# Patient Record
Sex: Male | Born: 1990 | Race: White | Hispanic: No | Marital: Single | Smoking: Current every day smoker
Health system: Southern US, Community
[De-identification: ages and names within clinical notes are randomized; demographics above are authoritative.]

## PROBLEM LIST (undated history)

## (undated) HISTORY — PX: KNEE SURGERY: SHX244

## (undated) HISTORY — PX: FINGER AMPUTATION: SHX636

---
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
## 2008-04-29 ENCOUNTER — Emergency Department: Payer: Self-pay | Admitting: Emergency Medicine

## 2008-11-04 ENCOUNTER — Emergency Department: Payer: Self-pay | Admitting: Emergency Medicine

## 2009-01-06 ENCOUNTER — Emergency Department: Payer: Self-pay | Admitting: Emergency Medicine

## 2009-02-10 ENCOUNTER — Emergency Department: Payer: Self-pay | Admitting: Emergency Medicine

## 2009-03-10 ENCOUNTER — Emergency Department: Payer: Self-pay | Admitting: Emergency Medicine

## 2009-03-22 ENCOUNTER — Emergency Department: Payer: Self-pay | Admitting: Internal Medicine

## 2010-01-28 ENCOUNTER — Emergency Department: Payer: Self-pay | Admitting: Emergency Medicine

## 2011-03-07 ENCOUNTER — Emergency Department: Payer: Self-pay | Admitting: Internal Medicine

## 2011-03-07 LAB — URINALYSIS, COMPLETE
Bacteria: NONE SEEN
Bilirubin,UR: NEGATIVE
Blood: NEGATIVE
Glucose,UR: NEGATIVE mg/dL (ref 0–75)
Ketone: NEGATIVE
Leukocyte Esterase: NEGATIVE
Nitrite: NEGATIVE
Ph: 6 (ref 4.5–8.0)
Protein: NEGATIVE
RBC,UR: NONE SEEN /HPF (ref 0–5)
Specific Gravity: 1.025 (ref 1.003–1.030)
Squamous Epithelial: NONE SEEN
WBC UR: 1 /HPF (ref 0–5)

## 2011-03-07 LAB — COMPREHENSIVE METABOLIC PANEL
Albumin: 4.2 g/dL (ref 3.4–5.0)
Alkaline Phosphatase: 52 U/L (ref 50–136)
Anion Gap: 8 (ref 7–16)
BUN: 13 mg/dL (ref 7–18)
Bilirubin,Total: 1.9 mg/dL — ABNORMAL HIGH (ref 0.2–1.0)
Calcium, Total: 9 mg/dL (ref 8.5–10.1)
Chloride: 110 mmol/L — ABNORMAL HIGH (ref 98–107)
Co2: 26 mmol/L (ref 21–32)
Creatinine: 0.89 mg/dL (ref 0.60–1.30)
EGFR (African American): >60
EGFR (Non-African Amer.): >60
Glucose: 120 mg/dL — ABNORMAL HIGH (ref 65–99)
Osmolality: 288 (ref 275–301)
Potassium: 4.4 mmol/L (ref 3.5–5.1)
SGOT(AST): 24 U/L (ref 15–37)
SGPT (ALT): 23 U/L
Sodium: 144 mmol/L (ref 136–145)
Total Protein: 7.1 g/dL (ref 6.4–8.2)

## 2011-03-07 LAB — CBC
HCT: 47.5 % (ref 40.0–52.0)
HGB: 15.6 g/dL (ref 13.0–18.0)
MCH: 31.1 pg (ref 26.0–34.0)
MCHC: 32.9 g/dL (ref 32.0–36.0)
MCV: 95 fL (ref 80–100)
Platelet: 136 10*3/uL — ABNORMAL LOW (ref 150–440)
RBC: 5.03 10*6/uL (ref 4.40–5.90)
RDW: 13.2 % (ref 11.5–14.5)
WBC: 10.1 10*3/uL (ref 3.8–10.6)

## 2011-03-07 LAB — LIPASE, BLOOD: Lipase: 65 U/L — ABNORMAL LOW (ref 73–393)

## 2012-01-07 ENCOUNTER — Emergency Department: Payer: Self-pay | Admitting: Emergency Medicine

## 2012-03-12 ENCOUNTER — Emergency Department: Payer: Self-pay | Admitting: Emergency Medicine

## 2012-03-12 LAB — RAPID INFLUENZA A&B ANTIGENS

## 2012-03-15 LAB — BETA STREP CULTURE(ARMC)

## 2012-05-19 ENCOUNTER — Emergency Department: Payer: Self-pay | Admitting: Emergency Medicine
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
## 2014-03-13 ENCOUNTER — Emergency Department: Payer: Self-pay | Admitting: Emergency Medicine

## 2014-09-13 DIAGNOSIS — S61411A Laceration without foreign body of right hand, initial encounter: Secondary | ICD-10-CM | POA: Insufficient documentation

## 2014-09-13 DIAGNOSIS — Y9389 Activity, other specified: Secondary | ICD-10-CM | POA: Insufficient documentation

## 2014-09-13 DIAGNOSIS — W260XXA Contact with knife, initial encounter: Secondary | ICD-10-CM | POA: Insufficient documentation

## 2014-09-13 DIAGNOSIS — Y9289 Other specified places as the place of occurrence of the external cause: Secondary | ICD-10-CM | POA: Insufficient documentation

## 2014-09-13 DIAGNOSIS — Y998 Other external cause status: Secondary | ICD-10-CM | POA: Insufficient documentation

## 2014-09-13 MED ORDER — OXYCODONE-ACETAMINOPHEN 5-325 MG PO TABS
ORAL_TABLET | ORAL | Status: AC
  Administered 2014-09-13: 1 via ORAL
  Filled 2014-09-13: qty 1

## 2014-09-13 MED ORDER — OXYCODONE-ACETAMINOPHEN 5-325 MG PO TABS
1.0000 | ORAL_TABLET | Freq: Once | ORAL | Status: AC
  Administered 2014-09-13: 1 via ORAL

## 2014-09-13 NOTE — ED Notes (Signed)
Pt to ED with laceration to palmar side of R hand. Bleeding is controlled, placed clean bandage in triage.

## 2014-09-14 ENCOUNTER — Emergency Department
Admission: EM | Admit: 2014-09-14 | Discharge: 2014-09-14 | Disposition: A | Discharge status: Home / Self Care | Payer: Self-pay | Attending: Emergency Medicine | Admitting: Emergency Medicine

## 2014-09-14 DIAGNOSIS — IMO0002 Reserved for concepts with insufficient information to code with codable children: Secondary | ICD-10-CM

## 2014-09-14 MED ORDER — BACITRACIN ZINC 500 UNIT/GM EX OINT
TOPICAL_OINTMENT | CUTANEOUS | Status: AC
  Filled 2014-09-14: qty 0.9

## 2014-09-14 MED ORDER — OXYCODONE-ACETAMINOPHEN 5-325 MG PO TABS
1.0000 | ORAL_TABLET | Freq: Once | ORAL | Status: AC
  Administered 2014-09-14: 1 via ORAL
  Filled 2014-09-14: qty 1

## 2014-09-14 MED ORDER — HYDROCODONE-ACETAMINOPHEN 5-325 MG PO TABS: 1.0000 | ORAL_TABLET | ORAL | Status: DC | PRN

## 2014-09-14 MED ORDER — LIDOCAINE HCL (PF) 1 % IJ SOLN
INTRAMUSCULAR | Status: AC
  Filled 2014-09-14: qty 5

## 2014-09-14 NOTE — Discharge Instructions (Signed)
Laceration Care, Adult A laceration is a cut that goes through all layers of the skin. The cut goes into the tissue beneath the skin. HOME CARE For stitches (sutures) or staples:  Keep the cut clean and dry.  If you have a bandage (dressing), change it at least once a day. Change the bandage if it gets wet or dirty, or as told by your doctor.  Wash the cut with soap and water 2 times a day. Rinse the cut with water. Pat it dry with a clean towel.  Put a thin layer of medicated cream on the cut as told by your doctor.  You may shower after the first 24 hours. Do not soak the cut in water until the stitches are removed.  Only take medicines as told by your doctor.  Have your stitches or staples removed as told by your doctor. For skin adhesive strips:  Keep the cut clean and dry.  Do not get the strips wet. You may take a bath, but be careful to keep the cut dry.  If the cut gets wet, pat it dry with a clean towel.  The strips will fall off on their own. Do not remove the strips that are still stuck to the cut. For wound glue:  You may shower or take baths. Do not soak or scrub the cut. Do not swim. Avoid heavy sweating until the glue falls off on its own. After a shower or bath, pat the cut dry with a clean towel.  Do not put medicine on your cut until the glue falls off.  If you have a bandage, do not put tape over the glue.  Avoid lots of sunlight or tanning lamps until the glue falls off. Put sunscreen on the cut for the first year to reduce your scar.  The glue will fall off on its own. Do not pick at the glue. You may need a tetanus shot if:  You cannot remember when you had your last tetanus shot.  You have never had a tetanus shot. If you need a tetanus shot and you choose not to have one, you may get tetanus. Sickness from tetanus can be serious. GET HELP RIGHT AWAY IF:   Your pain does not get better with medicine.  Your arm, hand, leg, or foot loses feeling  (numbness) or changes color.  Your cut is bleeding.  Your joint feels weak, or you cannot use your joint.  You have painful lumps on your body.  Your cut is red, puffy (swollen), or painful.  You have a red line on the skin near the cut.  You have yellowish-white fluid (pus) coming from the cut.  You have a fever.  You have a bad smell coming from the cut or bandage.  Your cut breaks open before or after stitches are removed.  You notice something coming out of the cut, such as wood or glass.  You cannot move a finger or toe. MAKE SURE YOU:   Understand these instructions.  Will watch your condition.  Will get help right away if you are not doing well or get worse. Document Released: 07/28/2007 Document Revised: 05/03/2011 Document Reviewed: 08/04/2010 [facility] Patient Information 2015 [place], [place]. This information is not intended to replace advice given to you by your health care provider. Make sure you discuss any questions you have with your health care provider.      Please follow-up with your primary care doctor or return to the emergency department in  7-10 days for suture removal. This keep the area clean, covered in Neosporin, and a bandage at all times. Return to the emergency department for any signs of infection such as increased pain, increased redness, pus or if you develop a fever.

## 2014-09-14 NOTE — ED Provider Notes (Signed)
[facility] Emergency Department Provider Note  Time seen: 4:43 AM  I have reviewed the triage vital signs and the nursing notes.   HISTORY  Chief Complaint Extremity Laceration    HPI Andrew Hanna is a 24 y.o. male with no medical problems presents the emergency department with a laceration to his right palm. According to the patient he was opening a flip knife, when it cut his palm. He states immediate pain and bleeding. Denies any numbness. Bleeding was controlled upon my examination. Patient states his tetanus shot was updated 3.5 years ago. Patient states moderate pain to the right hand.     No past medical history on file.  There are no active problems to display for this patient.   No past surgical history on file.  No current outpatient prescriptions on file.  Allergies Review of patient's allergies indicates no known allergies.  No family history on file.  Social History History  Substance Use Topics  . Smoking status: Not on file  . Smokeless tobacco: Not on file  . Alcohol Use: Not on file    Review of Systems Constitutional: Negative for fever. Cardiovascular: Negative for chest pain. Respiratory: Negative for shortness of breath. Gastrointestinal: Negative for abdominal pain Musculoskeletal: Positive for right hand pain. Skin: Laceration to right hand. 10-point ROS otherwise negative.  ____________________________________________   PHYSICAL EXAM:  VITAL SIGNS: ED Triage Vitals  Enc Vitals Group     BP 09/13/14 2234 131/79 mmHg     Pulse Rate 09/13/14 2234 72     Resp 09/13/14 2234 18     Temp 09/13/14 2234 97.8 F (36.6 C)     Temp Source 09/13/14 2234 Oral     SpO2 09/13/14 2234 100 %     Weight 09/13/14 2234 165 lb (74.844 kg)     Height 09/13/14 2234  (1.93 m)     Head Cir --      Peak Flow --      Pain Score 09/13/14 2235 8     Pain Loc --      Pain Edu? --      Excl. in GC? --      Constitutional: Alert and oriented. Well appearing and in no distress. ENT   Head: Normocephalic and atraumatic. Cardiovascular: Normal rate, regular rhythm. No murmurs Respiratory: Normal respiratory effort without tachypnea nor retractions. Breath sounds are clear  Gastrointestinal: Soft and nontender. No distention. Musculoskeletal: Patient with 3 cm laceration to the right palm. Hemostatic. Sensation intact all fingers. Tendon function intact all fingers. Good cap refill in all fingers Neurologic:  Normal speech and language. No gross focal neurologic deficits  Skin:  Laceration as above. Psychiatric: Mood and affect are normal. Speech and behavior are normal.   ____________________________________________     INITIAL IMPRESSION / ASSESSMENT AND PLAN / ED COURSE  Pertinent labs & imaging results that were available during my care of the patient were reviewed by me and considered in my medical decision making (see chart for details).  Patient with 3 cm laceration to the palmar aspect of his right hand with a knife. Cut was accidental. Tetanus is up-to-date. Laceration repaired with 4 sutures. We'll discharge the patient home.  LACERATION REPAIR Performed by: Minna Antis Authorized by: Minna Antis Consent: Verbal consent obtained. Risks and benefits: risks, benefits and alternatives were discussed Consent given by: patient Patient identity confirmed: provided demographic data Prepped and Draped in normal sterile fashion Wound explored  Laceration Location: Right  palm  Laceration Length: 3 cm  No Foreign Bodies seen or palpated  Anesthesia: local infiltration  Local anesthetic: lidocaine 1 % without epinephrine  Anesthetic total: 5 ml  Irrigation method: syringe Amount of cleaning: standard  Skin closure: 4-0 nylon   Number of sutures: 4   Technique: Simple interrupted   Patient tolerance: Patient tolerated the procedure well with no  immediate complications.   ____________________________________________   FINAL CLINICAL IMPRESSION(S) / ED DIAGNOSES  Laceration to right hand   Minna Antis, MD 09/14/14 [phone]

## 2014-10-03 ENCOUNTER — Encounter: Payer: Self-pay | Admitting: Emergency Medicine

## 2014-10-03 ENCOUNTER — Emergency Department
Admission: EM | Admit: 2014-10-03 | Discharge: 2014-10-03 | Disposition: A | Discharge status: Home / Self Care | Payer: Self-pay | Attending: Emergency Medicine | Admitting: Emergency Medicine

## 2014-10-03 ENCOUNTER — Emergency Department: Payer: Self-pay

## 2014-10-03 DIAGNOSIS — M25462 Effusion, left knee: Secondary | ICD-10-CM | POA: Insufficient documentation

## 2014-10-03 DIAGNOSIS — Z720 Tobacco use: Secondary | ICD-10-CM | POA: Insufficient documentation

## 2014-10-03 NOTE — Discharge Instructions (Signed)
Wear your knee immobilizer. Follow up with orthopedics. Return to the ER for symptoms that change or worsen if you are unable to schedule an appointment.  Knee Effusion The medical term for having fluid in your knee is effusion. This is often due to an internal derangement of the knee. This means something is wrong inside the knee. Some of the causes of fluid in the knee may be torn cartilage, a torn ligament, or bleeding into the joint from an injury. Your knee is likely more difficult to bend and move. This is often because there is increased pain and pressure in the joint. The time it takes for recovery from a knee effusion depends on different factors, including:   Type of injury.  Your age.  Physical and medical conditions.  Rehabilitation Strategies. How long you will be away from your normal activities will depend on what kind of knee problem you have and how much damage is present. Your knee has two types of cartilage. Articular cartilage covers the bone ends and lets your knee bend and move smoothly. Two menisci, thick pads of cartilage that form a rim inside the joint, help absorb shock and stabilize your knee. Ligaments bind the bones together and support your knee joint. Muscles move the joint, help support your knee, and take stress off the joint itself. CAUSES  Often an effusion in the knee is caused by an injury to one of the menisci. This is often a tear in the cartilage. Recovery after a meniscus injury depends on how much meniscus is damaged and whether you have damaged other knee tissue. Small tears may heal on their own with conservative treatment. Conservative means rest, limited weight bearing activity and muscle strengthening exercises. Your recovery may take up to 6 weeks.  TREATMENT  Larger tears may require surgery. Meniscus injuries may be treated during arthroscopy. Arthroscopy is a procedure in which your surgeon uses a small telescope like instrument to look in your  knee. Your caregiver can make a more accurate diagnosis (learning what is wrong) by performing an arthroscopic procedure. If your injury is on the inner margin of the meniscus, your surgeon may trim the meniscus back to a smooth rim. In other cases your surgeon will try to repair a damaged meniscus with stitches (sutures). This may make rehabilitation take longer, but may provide better long term result by helping your knee keep its shock absorption capabilities. Ligaments which are completely torn usually require surgery for repair. HOME CARE INSTRUCTIONS  Use crutches as instructed.  If a brace is applied, use as directed.  Once you are home, an ice pack applied to your swollen knee may help with discomfort and help decrease swelling.  Keep your knee raised (elevated) when you are not up and around or on crutches.  Only take over-the-counter or prescription medicines for pain, discomfort, or fever as directed by your caregiver.  Your caregivers will help with instructions for rehabilitation of your knee. This often includes strengthening exercises.  You may resume a normal diet and activities as directed. SEEK MEDICAL CARE IF:   There is increased swelling in your knee.  You notice redness, swelling, or increasing pain in your knee.  An unexplained oral temperature above 102 F (38.9 C) develops. SEEK IMMEDIATE MEDICAL CARE IF:   You develop a rash.  You have difficulty breathing.  You have any allergic reactions from medications you may have been given.  There is severe pain with any motion of the knee. MAKE  SURE YOU:   Understand these instructions.  Will watch your condition.  Will get help right away if you are not doing well or get worse. Document Released: 05/01/2003 Document Revised: 05/03/2011 Document Reviewed: 07/05/2007 [facility] Patient Information 2015 [place], [place]. This information is not intended to replace advice given to you by your health care  provider. Make sure you discuss any questions you have with your health care provider.

## 2014-10-03 NOTE — ED Notes (Signed)
Patient to ED with report of left knee pain, reports old injury and this morning at work unsure of what he did but pain became severe.

## 2014-10-03 NOTE — ED Provider Notes (Signed)
[facility] Emergency Department Provider Note ____________________________________________  Time seen: Approximately 12:45 PM  I have reviewed the triage vital signs and the nursing notes.   HISTORY  Chief Complaint Knee Pain   HPI Andrew Hanna is a 24 y.o. male who presents to the emergency department for sudden onset severe left knee pain while walking this morning. He has a history of a "torn ligaments" in the same knee, but has not had any issues lately. He denies new injury.   History reviewed. No pertinent past medical history.  There are no active problems to display for this patient.   History reviewed. No pertinent past surgical history.  Current Outpatient Rx  Name  Route  Sig  Dispense  Refill  . HYDROcodone-acetaminophen (NORCO/VICODIN) 5-325 MG per tablet   Oral   Take 1 tablet by mouth every 4 (four) hours as needed for moderate pain.   12 tablet   0     Allergies Review of patient's allergies indicates no known allergies.  History reviewed. No pertinent family history.  Social History Social History  Substance Use Topics  . Smoking status: Current Every Day Smoker  . Smokeless tobacco: None  . Alcohol Use: Yes    Review of Systems Constitutional: No recent illness. Eyes: No visual changes. ENT: No sore throat. Cardiovascular: Denies chest pain or palpitations. Respiratory: Denies shortness of breath. Gastrointestinal: No abdominal pain.  Genitourinary: Negative for dysuria. Musculoskeletal: Pain in left knee. Skin: Negative for rash. Neurological: Negative for headaches, focal weakness or numbness. 10-point ROS otherwise negative.  ____________________________________________   PHYSICAL EXAM:  VITAL SIGNS: ED Triage Vitals  Enc Vitals Group     BP 10/03/14 1052 134/82 mmHg     Pulse Rate 10/03/14 1052 68     Resp 10/03/14 1052 20     Temp 10/03/14 1052 97.8 F (36.6 C)     Temp Source 10/03/14 1052  Oral     SpO2 10/03/14 1052 100 %     Weight 10/03/14 1052 160 lb (72.576 kg)     Height 10/03/14 1052  (1.93 m)     Head Cir --      Peak Flow --      Pain Score 10/03/14 1052 8     Pain Loc --      Pain Edu? --      Excl. in GC? --     Constitutional: Alert and oriented. Well appearing and in no acute distress. Eyes: Conjunctivae are normal. EOMI. Head: Atraumatic. Nose: No congestion/rhinnorhea. Neck: No stridor.  Respiratory: Normal respiratory effort.   Musculoskeletal: Notable click present during straight leg raise. No tenderness with varus or valgus stressors. No edema Neurologic:  Normal speech and language. No gross focal neurologic deficits are appreciated. Speech is normal. No gait instability. Skin:  Skin is warm, dry and intact. Atraumatic. Psychiatric: Mood and affect are normal. Speech and behavior are normal.  ____________________________________________   LABS (all labs ordered are listed, but only abnormal results are displayed)  Labs Reviewed - No data to display ____________________________________________  RADIOLOGY No acute bony abnormality. ____________________________________________   PROCEDURES  Procedure(s) performed: None   ____________________________________________   INITIAL IMPRESSION / ASSESSMENT AND PLAN / ED COURSE  Pertinent labs & imaging results that were available during my care of the patient were reviewed by me and considered in my medical decision making (see chart for details).  Patient states that during the x-ray process when he turned his knee inward  for one of the films he felt a pop in the knee and that relieved the severe pain.  Patient states that he has a knee immobilizer home and will wear it while he is up and moving around. He does not want [place] to provide another one for him today. He was instructed to remove it at night and when resting.  He was advised to follow-up with orthopedics. He declined  prescription for any type of pain medication. He was advised to return to the emergency department for symptoms that change or worsen if he is unable schedule an appointment.  ____________________________________________   FINAL CLINICAL IMPRESSION(S) / ED DIAGNOSES  Final diagnoses:  Knee joint effusion, left       Chinita Pester, FNP 10/03/14 1253  Emily Filbert, MD 10/03/14 [phone]

## 2014-10-23 ENCOUNTER — Encounter: Payer: Self-pay | Admitting: Student

## 2014-10-23 ENCOUNTER — Emergency Department
Admission: EM | Admit: 2014-10-23 | Discharge: 2014-10-23 | Disposition: A | Discharge status: Home / Self Care | Payer: Self-pay | Attending: Emergency Medicine | Admitting: Emergency Medicine

## 2014-10-23 DIAGNOSIS — L255 Unspecified contact dermatitis due to plants, except food: Secondary | ICD-10-CM

## 2014-10-23 DIAGNOSIS — L237 Allergic contact dermatitis due to plants, except food: Secondary | ICD-10-CM | POA: Insufficient documentation

## 2014-10-23 DIAGNOSIS — Z720 Tobacco use: Secondary | ICD-10-CM | POA: Insufficient documentation

## 2014-10-23 DIAGNOSIS — Z7952 Long term (current) use of systemic steroids: Secondary | ICD-10-CM | POA: Insufficient documentation

## 2014-10-23 MED ORDER — PREDNISONE 10 MG PO TABS: 60.0000 mg | ORAL_TABLET | Freq: Every day | ORAL | Status: DC

## 2014-10-23 MED ORDER — DEXAMETHASONE SODIUM PHOSPHATE 10 MG/ML IJ SOLN
10.0000 mg | Freq: Once | INTRAMUSCULAR | Status: AC
  Administered 2014-10-23: 10 mg via INTRAMUSCULAR
  Filled 2014-10-23: qty 1

## 2014-10-23 MED ORDER — HYDROXYZINE HCL 25 MG PO TABS
25.0000 mg | ORAL_TABLET | Freq: Once | ORAL | Status: AC
  Administered 2014-10-23: 25 mg via ORAL
  Filled 2014-10-23: qty 1

## 2014-10-23 MED ORDER — HYDROXYZINE HCL 25 MG PO TABS: 25.0000 mg | ORAL_TABLET | Freq: Three times a day (TID) | ORAL | Status: DC | PRN

## 2014-10-23 NOTE — ED Notes (Signed)
Pt discharged with rx. No known reaction to IM noted

## 2014-10-23 NOTE — Discharge Instructions (Signed)
°  Zanfel or a similar product may help prevent poison ivy in the future.  Poison [employer] ivy is a rash caused by touching the leaves of the poison ivy plant. The rash often shows up 48 hours later. You might just have bumps, redness, and itching. Sometimes, blisters appear and break open. Your eyes may get puffy (swollen). Poison ivy often heals in 2 to 3 weeks without treatment. HOME CARE  If you touch poison ivy:  Wash your skin with soap and water right away. Wash under your fingernails. Do not rub the skin very hard.  Wash any clothes you were wearing.  Avoid poison ivy in the future. Poison ivy has 3 leaves on a stem.  Use medicine to help with itching as told by your doctor. Do not drive when you take this medicine.  Keep open sores dry, clean, and covered with a bandage and medicated cream, if needed.  Ask your doctor about medicine for children. GET HELP RIGHT AWAY IF:  You have open sores.  Redness spreads beyond the area of the rash.  There is yellowish white fluid (pus) coming from the rash.  Pain gets worse.  You have a temperature by mouth above 102 F (38.9 C), not controlled by medicine. MAKE SURE YOU:  Understand these instructions.  Will watch your condition.  Will get help right away if you are not doing well or get worse. Document Released: 03/13/2010 Document Revised: 05/03/2011 Document Reviewed: 03/13/2010 [facility] Patient Information 2015 [place], [place]. This information is not intended to replace advice given to you by your health care provider. Make sure you discuss any questions you have with your health care provider.

## 2014-10-23 NOTE — ED Notes (Signed)
Pt comes to ED for poison ivy that started monday. Rash noted throughout body with swelling under eyes. Denies any SOB or pain , just itching.

## 2014-10-23 NOTE — ED Provider Notes (Signed)
[facility] Emergency Department Provider Note ____________________________________________  Time seen: Approximately 9:24 AM  I have reviewed the triage vital signs and the nursing notes.   HISTORY  Chief Complaint Poison Ivy   HPI Andrew Hanna is a 24 y.o. male present to the emergency department for evaluation of poison ivy. He states that it started on Monday after dragging brush. It has continued to spread over his upper body into his face. He denies shortness of breath.   History reviewed. No pertinent past medical history.  There are no active problems to display for this patient.   Past Surgical History  Procedure Laterality Date  . Finger amputation      Current Outpatient Rx  Name  Route  Sig  Dispense  Refill  . HYDROcodone-acetaminophen (NORCO/VICODIN) 5-325 MG per tablet   Oral   Take 1 tablet by mouth every 4 (four) hours as needed for moderate pain.   12 tablet   0   . hydrOXYzine (ATARAX/VISTARIL) 25 MG tablet   Oral   Take 1 tablet (25 mg total) by mouth 3 (three) times daily as needed.   30 tablet   0   . predniSONE (DELTASONE) 10 MG tablet   Oral   Take 6 tablets (60 mg total) by mouth daily.   63 tablet   0     Allergies Review of patient's allergies indicates no known allergies.  No family history on file.  Social History Social History  Substance Use Topics  . Smoking status: Current Every Day Smoker    Types: Cigarettes  . Smokeless tobacco: Never Used  . Alcohol Use: Yes     Comment: occasional    Review of Systems   Constitutional: No fever/chills Eyes: No visual changes. ENT: No congestion or rhinorrhea Cardiovascular: Denies chest pain. Respiratory: Denies shortness of breath. Gastrointestinal: No abdominal pain.  No nausea, no vomiting.  No diarrhea.  No constipation. Genitourinary: Negative for dysuria. Musculoskeletal: Negative for back pain. Skin: Rash to upper body and  face Neurological: Negative for headaches, focal weakness or numbness.  10-point ROS otherwise negative.  ____________________________________________   PHYSICAL EXAM:  VITAL SIGNS: ED Triage Vitals  Enc Vitals Group     BP 10/23/14 0857 141/85 mmHg     Pulse Rate 10/23/14 0857 67     Resp 10/23/14 0857 18     Temp 10/23/14 0857 97.8 F (36.6 C)     Temp Source 10/23/14 0857 Oral     SpO2 10/23/14 0857 94 %     Weight 10/23/14 0857 165 lb (74.844 kg)     Height 10/23/14 0857  (1.93 m)     Head Cir --      Peak Flow --      Pain Score 10/23/14 0858 0     Pain Loc --      Pain Edu? --      Excl. in GC? --     Constitutional: Alert and oriented. Well appearing and in no acute distress. Eyes: Conjunctivae are normal. PERRL. EOMI. Head: Atraumatic. Nose: No congestion/rhinnorhea. Mouth/Throat: Mucous membranes are moist.  Oropharynx non-erythematous. No oral lesions. Neck: No stridor. Cardiovascular: Normal rate, regular rhythm.  Good peripheral circulation. Respiratory: Normal respiratory effort.  No retractions. Lungs CTAB. Gastrointestinal: Soft and nontender. No distention. No abdominal bruits.  Musculoskeletal: No lower extremity tenderness nor edema.  No joint effusions. Neurologic:  Normal speech and language. No gross focal neurologic deficits are appreciated. Speech is normal.  No gait instability. Skin:  Erythematous, vesicular rash noted to the trunk, upper extremities, and face; Negative for petechiae.  Psychiatric: Mood and affect are normal. Speech and behavior are normal.  ____________________________________________   LABS (all labs ordered are listed, but only abnormal results are displayed)  Labs Reviewed - No data to display ____________________________________________  EKG   ____________________________________________  RADIOLOGY   ____________________________________________   PROCEDURES  Procedure(s) performed:   ____________________________________________   INITIAL IMPRESSION / ASSESSMENT AND PLAN / ED COURSE  Pertinent labs & imaging results that were available during my care of the patient were reviewed by me and considered in my medical decision making (see chart for details).  IM Decadron given in the emergency department today as well as one dose of hydroxyzine. Patient was advised to start the oral prednisone tomorrow. He was advised to return to emergency department for symptoms change or worsen if he is unable schedule appointment with primary care. ____________________________________________   FINAL CLINICAL IMPRESSION(S) / ED DIAGNOSES  Final diagnoses:  Contact dermatitis due to plant       Chinita Pester, FNP 10/23/14 0930  Arnaldo Natal, MD 10/23/14 1710

## 2014-10-23 NOTE — ED Notes (Signed)
Pt presents to ED with rash all over body. Exposed to poison ivy Monday evening. Pt noted with swelling of eyes.

## 2014-11-22 ENCOUNTER — Encounter: Payer: Self-pay | Admitting: Emergency Medicine

## 2014-11-22 ENCOUNTER — Emergency Department
Admission: EM | Admit: 2014-11-22 | Discharge: 2014-11-22 | Disposition: A | Discharge status: Home / Self Care | Payer: Self-pay | Attending: Emergency Medicine | Admitting: Emergency Medicine

## 2014-11-22 DIAGNOSIS — Z720 Tobacco use: Secondary | ICD-10-CM | POA: Insufficient documentation

## 2014-11-22 DIAGNOSIS — K029 Dental caries, unspecified: Secondary | ICD-10-CM | POA: Insufficient documentation

## 2014-11-22 DIAGNOSIS — K0381 Cracked tooth: Secondary | ICD-10-CM | POA: Insufficient documentation

## 2014-11-22 MED ORDER — LIDOCAINE VISCOUS 2 % MT SOLN: 5.0000 mL | OROMUCOSAL | Status: DC | PRN

## 2014-11-22 MED ORDER — HYDROCODONE-ACETAMINOPHEN 5-325 MG PO TABS: 1.0000 | ORAL_TABLET | ORAL | Status: DC | PRN

## 2014-11-22 MED ORDER — AMOXICILLIN 500 MG PO TABS: 500.0000 mg | ORAL_TABLET | Freq: Two times a day (BID) | ORAL | Status: DC

## 2014-11-22 MED ORDER — IBUPROFEN 800 MG PO TABS: 800.0000 mg | ORAL_TABLET | Freq: Three times a day (TID) | ORAL | Status: DC | PRN

## 2014-11-22 NOTE — ED Notes (Signed)
Pt reports severe pain to left upper jaw. Pt reports thinks it is his wisdom teeth.

## 2014-11-22 NOTE — ED Provider Notes (Signed)
[facility] Emergency Department Provider Note  ____________________________________________  Time seen: Approximately 10:16 AM  I have reviewed the triage vital signs and the nursing notes.   HISTORY  Chief Complaint Dental Pain    HPI Andrew Hanna is a 24 y.o. male who presents for evaluation of left upper dental pain. Patient states that his wisdom tooth is cracked and is causing pains. Has not been able to get into the dentist secondary to money. She states pain is 10 over 10 and desires something to keep from infection from happening.   History reviewed. No pertinent past medical history.  There are no active problems to display for this patient.   Past Surgical History  Procedure Laterality Date  . Finger amputation      Current Outpatient Rx  Name  Route  Sig  Dispense  Refill  . amoxicillin (AMOXIL) 500 MG tablet   Oral   Take 1 tablet (500 mg total) by mouth 2 (two) times daily.   20 tablet   0   . HYDROcodone-acetaminophen (NORCO) 5-325 MG tablet   Oral   Take 1-2 tablets by mouth every 4 (four) hours as needed for moderate pain.   15 tablet   0   . ibuprofen (ADVIL,MOTRIN) 800 MG tablet   Oral   Take 1 tablet (800 mg total) by mouth every 8 (eight) hours as needed.   30 tablet   0   . lidocaine (XYLOCAINE) 2 % solution   Mouth/Throat   Use as directed 5 mLs in the mouth or throat as needed for mouth pain (apply to affected area as needed.).   100 mL   0     Allergies Review of patient's allergies indicates no known allergies.  No family history on file.  Social History Social History  Substance Use Topics  . Smoking status: Current Every Day Smoker    Types: Cigarettes  . Smokeless tobacco: Never Used  . Alcohol Use: Yes     Comment: occasional    Review of Systems Constitutional: No fever/chills Eyes: No visual changes. ENT: No sore throat. Positive for dental pain  Cardiovascular: Denies chest  pain. Respiratory: Denies shortness of breath. Gastrointestinal: No abdominal pain.  No nausea, no vomiting.  No diarrhea.  No constipation. Genitourinary: Negative for dysuria. Musculoskeletal: Negative for back pain. Skin: Negative for rash. Neurological: Negative for headaches, focal weakness or numbness.  10-point ROS otherwise negative.  ____________________________________________   PHYSICAL EXAM:  VITAL SIGNS: ED Triage Vitals  Enc Vitals Group     BP 11/22/14 1007 129/98 mmHg     Pulse Rate 11/22/14 1007 86     Resp 11/22/14 1007 20     Temp 11/22/14 1007 98.1 F (36.7 C)     Temp Source 11/22/14 1007 Oral     SpO2 11/22/14 1007 98 %     Weight 11/22/14 1007 155 lb (70.308 kg)     Height 11/22/14 1007  (1.905 m)     Head Cir --      Peak Flow --      Pain Score 11/22/14 1007 10     Pain Loc --      Pain Edu? --      Excl. in GC? --     Constitutional: Alert and oriented. Well appearing and in no acute distress. Eyes: Conjunctivae are normal. PERRL. EOMI. Head: Atraumatic. Nose: No congestion/rhinnorhea. Mouth/Throat: Mucous membranes are moist.  Oropharynx non-erythematous. Obvious dental caries left upper  wisdom tooth area with her gums slightly erythematous. No evidence of pustular drainage Neck: No stridor.  No adenopathy noted Cardiovascular: Normal rate, regular rhythm. Grossly normal heart sounds.  Good peripheral circulation. Respiratory: Normal respiratory effort.  No retractions. Lungs CTAB. Musculoskeletal: No lower extremity tenderness nor edema.  No joint effusions. Neurologic:  Normal speech and language. No gross focal neurologic deficits are appreciated. No gait instability. Skin:  Skin is warm, dry and intact. No rash noted. Psychiatric: Mood and affect are normal. Speech and behavior are normal.  ____________________________________________   LABS (all labs ordered are listed, but only abnormal results are displayed)  Labs Reviewed -  No data to display ____________________________________________   PROCEDURES  Procedure(s) performed: None  Critical Care performed: No  ____________________________________________   INITIAL IMPRESSION / ASSESSMENT AND PLAN / ED COURSE  Pertinent labs & imaging results that were available during my care of the patient were reviewed by me and considered in my medical decision making (see chart for details).  Acute dental caries with wisdom teeth pain. Rx given for Amoxil 500 mg 3 times a day, Motrin 800 mg 3 times a day, viscous lidocaine to apply to the gums, and hydrocodone as needed for pain. Patient to follow up with PCP or a list of local dentists has been provided. ____________________________________________   FINAL CLINICAL IMPRESSION(S) / ED DIAGNOSES  Final diagnoses:  Pain due to dental caries      Evangeline Dakin, PA-C 11/22/14 1031  Jene Every, MD 11/22/14 1440

## 2014-11-22 NOTE — Discharge Instructions (Signed)
OPTIONS FOR DENTAL FOLLOW UP CARE ° °[place] Department of Health and Human Services - Local Safety Net Dental Clinics °http://www.ncdhhs.gov/dph/oralhealth/services/safetynetclinics.htm °  °[facility] (336-562-3123) ° °[facility] [place] ([phone]) ° °[facility] ([phone] ext [phone]) ° °[facility] ([phone]) ° °[facility] ([phone]) °This clinic caters to the indigent population and is on a lottery system. °Location: °[facility], [address], [place] °Clinic Hours: °Wednesdays from 6pm - 9pm, patients seen by a lottery system. °For dates, call or go to [email or web address].edu/[facility]/patients/Dental-[facility] °Services: °Cleanings, fillings and simple extractions. °Payment Options: °DENTAL WORK IS FREE OF CHARGE. Bring proof of income or support. °Best way to get seen: °Arrive at 5:15 pm - this is a lottery, NOT first come/first serve, so arriving earlier will not increase your chances of being seen. °  °  °[facility] °[phone] °Select option 1 for emergencies °  °Location: °[facility], [address], [place] °Clinic Hours: °No walk-ins accepted - call the day before to schedule an appointment. °Check in times are 9:30 am and 1:30 pm. °Services: °Simple extractions, temporary fillings, pulpectomy/pulp debridement, uncomplicated abscess drainage. °Payment Options: °PAYMENT IS DUE AT THE TIME OF SERVICE.  Fee is usually $100-200, additional surgical procedures (e.g. abscess drainage) may be extra. °Cash, checks, Visa/MasterCard accepted.  Can file Medicaid if patient is covered for dental - patient should call case worker to check. °No discount for [facility] patients. °Best way to get seen: °MUST call the day before and get onto the schedule. Can usually be seen the next 1-2 days. No walk-ins accepted. °  °  °[facility] °[phone] °   °Location: °[facility], [address], [place] °Clinic Hours: °M, W, Th, F 8am or 1:30pm, Tues 9a or 1:30 - first come/first served. °Services: °Simple extractions, temporary fillings, uncomplicated abscess drainage.  You do not need to be an [place] resident. °Payment Options: °PAYMENT IS DUE AT THE TIME OF SERVICE. °Dental insurance, otherwise sliding scale - bring proof of income or support. °Depending on income and treatment needed, cost is usually $50-200. °Best way to get seen: °Arrive early as it is first come/first served. °  °  °[facility] °[phone] °  °Location: °[address] °Clinic Hours: °Mon-Thu 8a-5p °Services: °Most basic dental services including extractions and fillings. °Payment Options: °PAYMENT IS DUE AT THE TIME OF SERVICE. °Sliding scale, up to 50% off - bring proof if income or support. °Medicaid with dental option accepted. °Best way to get seen: °Call to schedule an appointment, can usually be seen within 2 weeks OR they will try to see walk-ins - show up at 8a or 2p (you may have to wait). °  °  °[facility] °[phone] °[place] RESIDENTS ONLY °  °Location: °[facility], [address], [place],  [address] °Clinic Hours: By appointment only. °Monday - Thursday 8am-5pm, Friday 8am-12pm °Services: Cleanings, fillings, extractions. °Payment Options: °PAYMENT IS DUE AT THE TIME OF SERVICE. °Cash, Visa or MasterCard. Sliding scale - $30 minimum per service. °Best way to get seen: °Come in to office, complete packet and make an appointment - need proof of income °or support monies for each household member and proof of [place] residence. °Usually takes about a month to get in. °  °  °[facility] °[phone] °  °Location: °[address]   [place] [facility] Hours: Walk-in Urgent Care Dental Services are offered Monday-Friday  mornings only. The numbers of emergencies accepted daily is limited to the number of providers available. Maximum 15 - Mondays, Wednesdays & Thursdays Maximum 10 - Tuesdays & Fridays Services: You do not need to be a [facility] resident to be seen for a dental emergency. Emergencies are defined as pain, swelling, abnormal bleeding, or dental trauma. Walkins will receive x-rays if needed. NOTE: Dental cleaning is not an emergency. Payment Options: PAYMENT IS DUE AT THE TIME OF SERVICE. Minimum co-pay is $40.00 for uninsured patients. Minimum co-pay is $3.00 for Medicaid with dental coverage. Dental Insurance is accepted and must be presented at time of visit. Medicare does not cover dental. Forms of payment: Cash, credit card, checks. Best way to get seen: If not previously registered with the clinic, walk-in dental registration begins at 7:15 am and is on a first come/first serve basis. If previously registered with the clinic, call to make an appointment.     [facility] [phone] [place] RESIDENTS ONLY   Location: [address], [place], [place] Clinic Hours: Mon-Thu 10a-2p Services: Extractions only! Payment Options: FREE (donations accepted) - bring proof of income or support Best way to get seen: Call and schedule an appointment OR come at 8am on the 1st Monday of every month (except for holidays) when it is first come/first served.     [facility] [phone]   Location: [address] [place], [place] Clinic Hours: Friday mornings Services, Payment Options, Best way to get seen: Call for info   Dental Pain A tooth ache may be caused by cavities (tooth decay). Cavities expose the nerve of the tooth to air and hot or cold temperatures. It may come from an infection or abscess (also called a boil or furuncle) around your tooth. It is also often caused by dental caries (tooth decay). This causes the pain you are having. DIAGNOSIS  Your caregiver  can diagnose this problem by exam. TREATMENT   If caused by an infection, it may be treated with medications which kill germs (antibiotics) and pain medications as prescribed by your caregiver. Take medications as directed.  Only take over-the-counter or prescription medicines for pain, discomfort, or fever as directed by your caregiver.  Whether the tooth ache today is caused by infection or dental disease, you should see your dentist as soon as possible for further care. SEEK MEDICAL CARE IF: The exam and treatment you received today has been provided on an emergency basis only. This is not a substitute for complete medical or dental care. If your problem worsens or new problems (symptoms) appear, and you are unable to meet with your dentist, call or return to this location. SEEK IMMEDIATE MEDICAL CARE IF:   You have a fever.  You develop redness and swelling of your face, jaw, or neck.  You are unable to open your mouth.  You have severe pain uncontrolled by pain medicine. MAKE SURE YOU:   Understand these instructions.  Will watch your condition.  Will get help right away if you are not doing well or get worse. Document Released: 02/08/2005 Document Revised: 05/03/2011 Document Reviewed: 09/27/2007 [facility] Patient Information 2015 [place], [place]. This information is not intended to replace advice given to you by your health care provider. Make sure you discuss any questions you have with your health care provider.  Dental Care and Dentist Visits Dental care supports good overall health. Regular dental visits can also  help you avoid dental pain, bleeding, infection, and other more serious health problems in the future. It is important to keep the mouth healthy because diseases in the teeth, gums, and other oral tissues can spread to other areas of the body. Some problems, such as diabetes, heart disease, and pre-term labor have been associated with poor oral health.  See your  dentist every 6 months. If you experience emergency problems such as a toothache or broken tooth, go to the dentist right away. If you see your dentist regularly, you may catch problems early. It is easier to be treated for problems in the early stages.  WHAT TO EXPECT AT A DENTIST VISIT  Your dentist will look for many common oral health problems and recommend proper treatment. At your regular dental visit, you can expect:  Gentle cleaning of the teeth and gums. This includes scraping and polishing. This helps to remove the sticky substance around the teeth and gums (plaque). Plaque forms in the mouth shortly after eating. Over time, plaque hardens on the teeth as tartar. If tartar is not removed regularly, it can cause problems. Cleaning also helps remove stains.  Periodic X-rays. These pictures of the teeth and supporting bone will help your dentist assess the health of your teeth.  Periodic fluoride treatments. Fluoride is a natural mineral shown to help strengthen teeth. Fluoride treatmentinvolves applying a fluoride gel or varnish to the teeth. It is most commonly done in children.  Examination of the mouth, tongue, jaws, teeth, and gums to look for any oral health problems, such as:  Cavities (dental caries). This is decay on the tooth caused by plaque, sugar, and acid in the mouth. It is best to catch a cavity when it is small.  Inflammation of the gums caused by plaque buildup (gingivitis).  Problems with the mouth or malformed or misaligned teeth.  Oral cancer or other diseases of the soft tissues or jaws. KEEP YOUR TEETH AND GUMS HEALTHY For healthy teeth and gums, follow these general guidelines as well as your dentist's specific advice:  Have your teeth professionally cleaned at the dentist every 6 months.  Brush twice daily with a fluoride toothpaste.  Floss your teeth daily.  Ask your dentist if you need fluoride supplements, treatments, or fluoride toothpaste.  Eat a  healthy diet. Reduce foods and drinks with added sugar.  Avoid smoking. TREATMENT FOR ORAL HEALTH PROBLEMS If you have oral health problems, treatment varies depending on the conditions present in your teeth and gums.  Your caregiver will most likely recommend good oral hygiene at each visit.  For cavities, gingivitis, or other oral health disease, your caregiver will perform a procedure to treat the problem. This is typically done at a separate appointment. Sometimes your caregiver will refer you to another dental specialist for specific tooth problems or for surgery. SEEK IMMEDIATE DENTAL CARE IF:  You have pain, bleeding, or soreness in the gum, tooth, jaw, or mouth area.  A permanent tooth becomes loose or separated from the gum socket.  You experience a blow or injury to the mouth or jaw area. Document Released: 10/21/2010 Document Revised: 05/03/2011 Document Reviewed: 10/21/2010 [facility] Patient Information 2015 [place], [place]. This information is not intended to replace advice given to you by your health care provider. Make sure you discuss any questions you have with your health care provider.

## 2014-12-12 ENCOUNTER — Emergency Department
Admission: EM | Admit: 2014-12-12 | Discharge: 2014-12-12 | Disposition: A | Discharge status: Home / Self Care | Payer: Self-pay | Attending: Emergency Medicine | Admitting: Emergency Medicine

## 2014-12-12 ENCOUNTER — Encounter: Payer: Self-pay | Admitting: Emergency Medicine

## 2014-12-12 DIAGNOSIS — G8929 Other chronic pain: Secondary | ICD-10-CM | POA: Insufficient documentation

## 2014-12-12 DIAGNOSIS — M25562 Pain in left knee: Secondary | ICD-10-CM | POA: Insufficient documentation

## 2014-12-12 DIAGNOSIS — Z720 Tobacco use: Secondary | ICD-10-CM | POA: Insufficient documentation

## 2014-12-12 DIAGNOSIS — Z792 Long term (current) use of antibiotics: Secondary | ICD-10-CM | POA: Insufficient documentation

## 2014-12-12 NOTE — Discharge Instructions (Signed)
Cryotherapy Cryotherapy is when you put ice on your injury. Ice helps lessen pain and puffiness (swelling) after an injury. Ice works the best when you start using it in the first 24 to 48 hours after an injury. HOME CARE  Put a dry or damp towel between the ice pack and your skin.  You may press gently on the ice pack.  Leave the ice on for no more than 10 to 20 minutes at a time.  Check your skin after 5 minutes to make sure your skin is okay.  Rest at least 20 minutes between ice pack uses.  Stop using ice when your skin loses feeling (numbness).  Do not use ice on someone who cannot tell you when it hurts. This includes small children and people with memory problems (dementia). GET HELP RIGHT AWAY IF:  You have white spots on your skin.  Your skin turns blue or pale.  Your skin feels waxy or hard.  Your puffiness gets worse. MAKE SURE YOU:   Understand these instructions.  Will watch your condition.  Will get help right away if you are not doing well or get worse.   This information is not intended to replace advice given to you by your health care provider. Make sure you discuss any questions you have with your health care provider.   Document Released: 07/28/2007 Document Revised: 05/03/2011 Document Reviewed: 10/01/2010 Elsevier Interactive Patient Education 2016 [employer]  Begin wearing her knee immobilizer, ice and elevate for swelling, take ibuprofen as needed for inflammation. Call orthopedist for an appointment. Dr. Joice LoftsPoggi is listed on your discharge papers

## 2014-12-12 NOTE — ED Notes (Signed)
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 Also states he noticed some popping of knee  Min swelling noted

## 2014-12-12 NOTE — ED Notes (Signed)
Pt to ed with c/o left knee pain x 2 days.  Pt states "it is popping in and out on its own"

## 2014-12-12 NOTE — ED Provider Notes (Signed)
[facility] Emergency Department Provider Note  ____________________________________________  Time seen: Approximately 10:30 AM  I have reviewed the triage vital signs and the nursing notes.   HISTORY  Chief Complaint Knee Pain  HPI Andrew Hanna is a 24 y.o. male is here for complaint of left knee pain for couple days. Patient states that he has had some popping of his knee. He has had a history of knee problems and was seen back in the summer for the same.Patient denies any new injury to his knee. He states that periodically it pops out and then pops back in. He has been wearing a neoprene sleeve which has not helped very much. He does have a knee immobilizer that he is going to begin wearing today. He also needs a note for work. Patient states that currently his pain is a 7 out of 10. He is been taking over-the-counter medication with some relief. When he was here previously for the same type injury he was given the name of an orthopedist but admits that he did not call for a follow-up appointment.   History reviewed. No pertinent past medical history.  There are no active problems to display for this patient.   Past Surgical History  Procedure Laterality Date  . Finger amputation      Current Outpatient Rx  Name  Route  Sig  Dispense  Refill  . amoxicillin (AMOXIL) 500 MG tablet   Oral   Take 1 tablet (500 mg total) by mouth 2 (two) times daily.   20 tablet   0   . HYDROcodone-acetaminophen (NORCO) 5-325 MG tablet   Oral   Take 1-2 tablets by mouth every 4 (four) hours as needed for moderate pain.   15 tablet   0   . ibuprofen (ADVIL,MOTRIN) 800 MG tablet   Oral   Take 1 tablet (800 mg total) by mouth every 8 (eight) hours as needed.   30 tablet   0   . lidocaine (XYLOCAINE) 2 % solution   Mouth/Throat   Use as directed 5 mLs in the mouth or throat as needed for mouth pain (apply to affected area as needed.).   100 mL   0      Allergies Review of patient's allergies indicates no known allergies.  History reviewed. No pertinent family history.  Social History Social History  Substance Use Topics  . Smoking status: Current Every Day Smoker    Types: Cigarettes  . Smokeless tobacco: Never Used  . Alcohol Use: Yes     Comment: occasional    Review of Systems Constitutional: No fever/chills Eyes: No visual changes. Cardiovascular: Denies chest pain. Respiratory: Denies shortness of breath. Gastrointestinal:   No nausea, no vomiting.   Musculoskeletal: Negative for back pain. Positive for left knee pain Skin: Negative for rash. Neurological: Negative for headaches, focal weakness or numbness.  10-point ROS otherwise negative.  ____________________________________________   PHYSICAL EXAM:  VITAL SIGNS: ED Triage Vitals  Enc Vitals Group     BP 12/12/14 0832 125/75 mmHg     Pulse Rate 12/12/14 0832 72     Resp 12/12/14 0832 14     Temp 12/12/14 0832 98.2 F (36.8 C)     Temp Source 12/12/14 0832 Oral     SpO2 12/12/14 0832 98 %     Weight 12/12/14 0832 160 lb (72.576 kg)     Height 12/12/14 0832 6\' 4"  (1.93 m)     Head Cir --  Peak Flow --      Pain Score 12/12/14 0830 7     Pain Loc --      Pain Edu? --      Excl. in GC? --     Constitutional: Alert and oriented. Well appearing and in no acute distress. Eyes: Conjunctivae are normal. PERRL. EOMI. Head: Atraumatic. Nose: No congestion/rhinnorhea. Neck: No stridor.   Cardiovascular: Normal rate, regular rhythm. Grossly normal heart sounds.  Good peripheral circulation. Respiratory: Normal respiratory effort.  No retractions. Lungs CTAB. Gastrointestinal: Soft and nontender. No distention.  Musculoskeletal: Left anterior knee with minimal effusion present. There is some tenderness on palpation of the patella. There is minimal movement of the patella. Normal gait was noted Neurologic:  Normal speech and language. No gross focal  neurologic deficits are appreciated. No gait instability. Skin:  Skin is warm, dry and intact. No rash noted. Psychiatric: Mood and affect are normal. Speech and behavior are normal.  ____________________________________________   LABS (all labs ordered are listed, but only abnormal results are displayed)  Labs Reviewed - No data to display  PROCEDURES  Procedure(s) performed: None  Critical Care performed: No  ____________________________________________   INITIAL IMPRESSION / ASSESSMENT AND PLAN / ED COURSE  Pertinent labs & imaging results that were available during my care of the patient were reviewed by me and considered in my medical decision making (see chart for details).  Patient is begin wearing his knee immobilizer for stability. He is also going to call and make an appointment with the orthopedist. Patient was given a note for work. ____________________________________________   FINAL CLINICAL IMPRESSION(S) / ED DIAGNOSES  Final diagnoses:  Knee pain, acute, left  Knee pain, chronic, left      Tommi Rumps, PA-C 12/12/14 1249  Emily Filbert, MD 12/12/14 1250

## 2015-09-29 ENCOUNTER — Emergency Department
Admission: EM | Admit: 2015-09-29 | Discharge: 2015-09-29 | Disposition: A | Discharge status: Home / Self Care | Payer: Self-pay | Attending: Student in an Organized Health Care Education/Training Program | Admitting: Student in an Organized Health Care Education/Training Program

## 2015-09-29 ENCOUNTER — Encounter: Payer: Self-pay | Admitting: Emergency Medicine

## 2015-09-29 ENCOUNTER — Emergency Department: Payer: Self-pay

## 2015-09-29 DIAGNOSIS — M1712 Unilateral primary osteoarthritis, left knee: Secondary | ICD-10-CM | POA: Insufficient documentation

## 2015-09-29 DIAGNOSIS — F17210 Nicotine dependence, cigarettes, uncomplicated: Secondary | ICD-10-CM | POA: Insufficient documentation

## 2015-09-29 MED ORDER — NAPROXEN 500 MG PO TABS: 500.0000 mg | ORAL_TABLET | Freq: Two times a day (BID) | ORAL | 0 refills | Status: DC

## 2015-09-29 MED ORDER — NAPROXEN 500 MG PO TABS
500.0000 mg | ORAL_TABLET | Freq: Once | ORAL | Status: AC
  Administered 2015-09-29: 500 mg via ORAL
  Filled 2015-09-29: qty 1

## 2015-09-29 NOTE — ED Triage Notes (Signed)
Pt at work walking & turning when his left knee dislocated at approx 1300.  Pt has history of recurrent knee dislocation and kept working on the knee until now.

## 2015-09-29 NOTE — ED Notes (Signed)
Pt refused to put on knee immobilizer on, pt took immobilizer home
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
## 2015-09-29 NOTE — Discharge Instructions (Signed)
Advised to use an elastic knee support and follow-up orthopedics.

## 2016-01-26 ENCOUNTER — Emergency Department
Admission: EM | Admit: 2016-01-26 | Discharge: 2016-01-26 | Disposition: A | Discharge status: Home / Self Care | Payer: BLUE CROSS/BLUE SHIELD | Attending: Emergency Medicine | Admitting: Emergency Medicine

## 2016-01-26 ENCOUNTER — Encounter: Payer: Self-pay | Admitting: Emergency Medicine

## 2016-01-26 DIAGNOSIS — Z791 Long term (current) use of non-steroidal anti-inflammatories (NSAID): Secondary | ICD-10-CM | POA: Insufficient documentation

## 2016-01-26 DIAGNOSIS — R112 Nausea with vomiting, unspecified: Secondary | ICD-10-CM | POA: Diagnosis not present

## 2016-01-26 DIAGNOSIS — F17210 Nicotine dependence, cigarettes, uncomplicated: Secondary | ICD-10-CM | POA: Insufficient documentation

## 2016-01-26 LAB — COMPREHENSIVE METABOLIC PANEL
ALBUMIN: 4.3 g/dL (ref 3.5–5.0)
ALK PHOS: 60 U/L (ref 38–126)
ALT: 34 U/L (ref 17–63)
AST: 26 U/L (ref 15–41)
Anion gap: 4 — ABNORMAL LOW (ref 5–15)
BILIRUBIN TOTAL: 0.8 mg/dL (ref 0.3–1.2)
BUN: 9 mg/dL (ref 6–20)
CALCIUM: 9.6 mg/dL (ref 8.9–10.3)
CO2: 30 mmol/L (ref 22–32)
Chloride: 105 mmol/L (ref 101–111)
Creatinine, Ser: 0.8 mg/dL (ref 0.61–1.24)
GFR calc Af Amer: >60 mL/min (ref >60)
GLUCOSE: 109 mg/dL — AB (ref 65–99)
POTASSIUM: 4.3 mmol/L (ref 3.5–5.1)
Sodium: 139 mmol/L (ref 135–145)
TOTAL PROTEIN: 7 g/dL (ref 6.5–8.1)

## 2016-01-26 LAB — CBC WITH DIFFERENTIAL/PLATELET
BASOS PCT: 1 %
Basophils Absolute: 0.1 10*3/uL (ref 0–0.1)
Eosinophils Absolute: 0.3 10*3/uL (ref 0–0.7)
Eosinophils Relative: 5 %
HEMATOCRIT: 45.1 % (ref 40.0–52.0)
HEMOGLOBIN: 15.3 g/dL (ref 13.0–18.0)
LYMPHS PCT: 29 %
Lymphs Abs: 1.6 10*3/uL (ref 1.0–3.6)
MCH: 31.5 pg (ref 26.0–34.0)
MCHC: 34 g/dL (ref 32.0–36.0)
MCV: 92.7 fL (ref 80.0–100.0)
MONO ABS: 0.4 10*3/uL (ref 0.2–1.0)
MONOS PCT: 7 %
NEUTROS ABS: 3.4 10*3/uL (ref 1.4–6.5)
NEUTROS PCT: 58 %
Platelets: 173 10*3/uL (ref 150–440)
RBC: 4.87 MIL/uL (ref 4.40–5.90)
RDW: 13.3 % (ref 11.5–14.5)
WBC: 5.7 10*3/uL (ref 3.8–10.6)

## 2016-01-26 MED ORDER — ONDANSETRON 4 MG PO TBDP
ORAL_TABLET | ORAL | Status: AC
  Filled 2016-01-26: qty 1

## 2016-01-26 MED ORDER — ONDANSETRON HCL 4 MG PO TABS: 4.0000 mg | ORAL_TABLET | Freq: Every day | ORAL | 0 refills | Status: DC | PRN

## 2016-01-26 MED ORDER — ONDANSETRON HCL 4 MG PO TABS
4.0000 mg | ORAL_TABLET | Freq: Once | ORAL | Status: AC
  Administered 2016-01-26: 4 mg via ORAL
  Filled 2016-01-26: qty 1

## 2016-01-26 NOTE — ED Notes (Signed)
Pt c/o NV X 3 days since eating at [employer]. Denies diarrhea or abdominal pain. Pt ambulated back to room without difficulty. Pt alert and oriented X4, active, cooperative, pt in NAD. RR even and unlabored, color WNL.

## 2016-01-26 NOTE — ED Triage Notes (Signed)
Reports eating at the [employer] on Sunday and has had n/v since.  NAD

## 2016-01-26 NOTE — ED Provider Notes (Signed)
[facility] Emergency Department Provider Note  ____________________________________________   First MD Initiated Contact with Patient 01/26/16 1806     (approximate)  I have reviewed the triage vital signs and the nursing notes.   HISTORY  Chief Complaint Emesis   HPI Andrew Hanna is a 25 y.o. male without any chronic medical conditions was presenting to the emergency department 2 days of nausea and vomiting. He says he has vomited total of 5 times over the past 2 days. He thinks this is related to food poisoning from [employer] which she ate 2 days ago. He denies any diarrhea. Denies any abdominal pain. Says that he has several friends also had a similar illness with nausea vomiting but says that they also diarrhea.   History reviewed. No pertinent past medical history.  There are no active problems to display for this patient.   Past Surgical History:  Procedure Laterality Date  . FINGER AMPUTATION      Prior to Admission medications   Medication Sig Start Date End Date Taking? Authorizing Provider  amoxicillin (AMOXIL) 500 MG tablet Take 1 tablet (500 mg total) by mouth 2 (two) times daily. 11/22/14   Evangeline Dakinharles M Beers, PA-C  HYDROcodone-acetaminophen (NORCO) 5-325 MG tablet Take 1-2 tablets by mouth every 4 (four) hours as needed for moderate pain. 11/22/14   Charmayne Sheerharles M Beers, PA-C  ibuprofen (ADVIL,MOTRIN) 800 MG tablet Take 1 tablet (800 mg total) by mouth every 8 (eight) hours as needed. 11/22/14   Charmayne Sheerharles M Beers, PA-C  lidocaine (XYLOCAINE) 2 % solution Use as directed 5 mLs in the mouth or throat as needed for mouth pain (apply to affected area as needed.). 11/22/14   Evangeline Dakinharles M Beers, PA-C  naproxen (NAPROSYN) 500 MG tablet Take 1 tablet (500 mg total) by mouth 2 (two) times daily with a meal. 09/29/15   Joni Reiningonald K Smith, PA-C    Allergies Patient has no known allergies.  No family history on file.  Social History Social History  Substance  Use Topics  . Smoking status: Current Every Day Smoker    Packs/day: 1.00    Types: Cigarettes  . Smokeless tobacco: Former NeurosurgeonUser  . Alcohol use No     Comment: occasional    Review of Systems Constitutional: No fever/chills Eyes: No visual changes. ENT: No sore throat. Cardiovascular: Denies chest pain. Respiratory: Denies shortness of breath. Gastrointestinal: No abdominal pain. No diarrhea.  No constipation. Genitourinary: Negative for dysuria. Musculoskeletal: Negative for back pain. Skin: Negative for rash. Neurological: Negative for headaches, focal weakness or numbness.  10-point ROS otherwise negative.  ____________________________________________   PHYSICAL EXAM:  VITAL SIGNS: ED Triage Vitals  Enc Vitals Group     BP 01/26/16 1650 120/67     Pulse Rate 01/26/16 1650 (!) 55     Resp 01/26/16 1650 16     Temp 01/26/16 1650 97.5 F (36.4 C)     Temp src --      SpO2 01/26/16 1650 98 %     Weight 01/26/16 1651 170 lb (77.1 kg)     Height 01/26/16 1651 6\' 4"  (1.93 m)     Head Circumference --      Peak Flow --      Pain Score --      Pain Loc --      Pain Edu? --      Excl. in GC? --     Constitutional: Alert and oriented. Well appearing and in no  acute distress. Eyes: Conjunctivae are normal. PERRL. EOMI. Head: Atraumatic. Nose: No congestion/rhinnorhea. Mouth/Throat: Mucous membranes are moist.   Neck: No stridor.   Cardiovascular: Normal rate, regular rhythm. Grossly normal heart sounds.   Respiratory: Normal respiratory effort.  No retractions. Lungs CTAB. Gastrointestinal: Soft and nontender. No distention. No CVA tenderness. Musculoskeletal: No lower extremity tenderness nor edema.  No joint effusions. Neurologic:  Normal speech and language. No gross focal neurologic deficits are appreciated. No gait instability. Skin:  Skin is warm, dry and intact. No rash noted. Psychiatric: Mood and affect are normal. Speech and behavior are  normal.  ____________________________________________   LABS (all labs ordered are listed, but only abnormal results are displayed)  Labs Reviewed  COMPREHENSIVE METABOLIC PANEL - Abnormal; Notable for the following:       Result Value   Glucose, Bld 109 (*)    Anion gap 4 (*)    All other components within normal limits  CBC WITH DIFFERENTIAL/PLATELET   ____________________________________________  EKG   ____________________________________________  RADIOLOGY   ____________________________________________   PROCEDURES  Procedure(s) performed:   Procedures  Critical Care performed:   ____________________________________________   INITIAL IMPRESSION / ASSESSMENT AND PLAN / ED COURSE  Pertinent labs & imaging results that were available during my care of the patient were reviewed by me and considered in my medical decision making (see chart for details).    Clinical Course    ----------------------------------------- 8:04 PM on 01/26/2016 -----------------------------------------  Reviewed labs with the patient. He says that his nausea has subsided after the Zofran but says that he has to leave to pick up his child before he can receive a by mouth challenge. We'll discharge with Zofran. Likely viral illness. Patient understands the plan and is willing to comply.  ____________________________________________   FINAL CLINICAL IMPRESSION(S) / ED DIAGNOSES  Nausea and vomiting.    NEW MEDICATIONS STARTED DURING THIS VISIT:  New Prescriptions   No medications on file     Note:  This document was prepared using Dragon voice recognition software and may include unintentional dictation errors.    Myrna Blazeravid Matthew Dylana Shaw, MD 01/26/16 2005

## 2016-03-13 ENCOUNTER — Emergency Department: Payer: Self-pay

## 2016-03-13 ENCOUNTER — Emergency Department
Admission: EM | Admit: 2016-03-13 | Discharge: 2016-03-13 | Disposition: A | Discharge status: Home / Self Care | Payer: Self-pay | Attending: Emergency Medicine | Admitting: Emergency Medicine

## 2016-03-13 DIAGNOSIS — W000XXA Fall on same level due to ice and snow, initial encounter: Secondary | ICD-10-CM | POA: Insufficient documentation

## 2016-03-13 DIAGNOSIS — Y929 Unspecified place or not applicable: Secondary | ICD-10-CM | POA: Insufficient documentation

## 2016-03-13 DIAGNOSIS — S8002XA Contusion of left knee, initial encounter: Secondary | ICD-10-CM | POA: Insufficient documentation

## 2016-03-13 DIAGNOSIS — Y939 Activity, unspecified: Secondary | ICD-10-CM | POA: Insufficient documentation

## 2016-03-13 DIAGNOSIS — F17210 Nicotine dependence, cigarettes, uncomplicated: Secondary | ICD-10-CM | POA: Insufficient documentation

## 2016-03-13 DIAGNOSIS — S8012XA Contusion of left lower leg, initial encounter: Secondary | ICD-10-CM

## 2016-03-13 DIAGNOSIS — Y999 Unspecified external cause status: Secondary | ICD-10-CM | POA: Insufficient documentation

## 2016-03-13 DIAGNOSIS — W19XXXA Unspecified fall, initial encounter: Secondary | ICD-10-CM

## 2016-03-13 NOTE — ED Triage Notes (Signed)
Left knee pain after fall and slip on ice yesterday. Pt reports that it happened at work, does not want to file workers comp. Recent surgery to left knee x 4 months ago. Pt able to ambulate but keeps leg straight.

## 2016-03-13 NOTE — ED Notes (Signed)
Pt verbalized understanding of discharge instructions. NAD at this time. 

## 2016-03-13 NOTE — ED Provider Notes (Signed)
[facility] Emergency Department Provider Note   First MD Initiated Contact with Patient 03/13/16 1458     (approximate)  I have reviewed the triage vital signs and the nursing notes.   HISTORY  Chief Complaint Knee Pain and Fall   HPI Andrew Hanna is a 26 y.o. male , NAD, who presents to the Emergency Department with a one day history of left knee pain from landing on it after a fall on ice. The patient states he is able to walk, "it just hurts," describing the pain as an achey 8/10 that radiates from the top of his lower leg and into his kneecap. Patient states ice and ibuprofen offer some relief. "A few months back" he had an ACL and meniscus reconstruction on his left leg. He has been pain free and with full function of the knee since then. He did not hit his head and denies any loss of consciousness, numbness, and tingling.    History reviewed. No pertinent past medical history.  There are no active problems to display for this patient.   Past Surgical History:  Procedure Laterality Date  . FINGER AMPUTATION      Prior to Admission medications   Not on File    Allergies Patient has no known allergies.  No family history on file.  Social History Social History  Substance Use Topics  . Smoking status: Current Every Day Smoker    Packs/day: 1.00    Types: Cigarettes  . Smokeless tobacco: Former NeurosurgeonUser  . Alcohol use No     Comment: occasional    Review of Systems Musculoskeletal: Positive for left anterior knee pain. Negative for decreased use of left leg. Negative for back pain. Skin: Negative for rash, swelling. Neurological: Negative for headaches, focal weakness, numbness, tingling. 10-point ROS otherwise negative.  ____________________________________________   PHYSICAL EXAM:  VITAL SIGNS: ED Triage Vitals [03/13/16 1313]  Enc Vitals Group     BP (!) 136/94     Pulse Rate 95     Resp 18     Temp 98 F (36.7 C)   Temp Source Oral     SpO2 100 %     Weight 170 lb (77.1 kg)     Height 6\' 4"  (1.93 m)     Head Circumference      Peak Flow      Pain Score 8     Pain Loc      Pain Edu?      Excl. in GC?    Constitutional: Alert and oriented. Well appearing and in no acute distress. Eyes: Conjunctivae are normal. PERRL. EOMI. Head: Atraumatic. Cardiovascular: Good peripheral circulation. LLE distal pulses 2+. Respiratory: Normal respiratory effort.  No retractions.  Musculoskeletal: Positive left anterior proximal tibial and patellar pain upon palpation. Full active ROM of LLE. L knee strength 5/5. No lower extremity obvious bony abnormality, crepitus, No joint effusions. Neurologic: Sensation in LLE intact. No gait instability. Normal speech and language. No gross focal neurologic deficits are appreciated.  Skin:  Skin is warm, dry and intact. Positive for contusion to left anterior knee and proximal tibia. Negative for erythema, edema, lacerations, or abrasions. Positive for scars from previous surgery. No rash noted. Psychiatric: Mood and affect are normal. Speech and behavior are normal.  ____________________________________________   LABS (all labs ordered are listed, but only abnormal results are displayed)  Labs Reviewed - No data to display ____________________________________________   PROCEDURES  Procedure(s) performed: None  Procedures  Critical Care performed: No  ____________________________________________   INITIAL IMPRESSION / ASSESSMENT AND PLAN / ED COURSE  Pertinent labs & imaging results that were available during my care of the patient were reviewed by me and considered in my medical decision making (see chart for details).  Status post fall. Radiology report negative for fracture, dislocation, or effusion. No acute osseus findings. Diagnosis left knee contusion. Discharge home with no medications. Patient states he will use ibuprofen at home for pain. Work note  provided; no work until Monday.       ____________________________________________   FINAL CLINICAL IMPRESSION(S) / ED DIAGNOSES  Final diagnoses:  Fall, initial encounter  Contusion of knee and lower leg, left, initial encounter      NEW MEDICATIONS STARTED DURING THIS VISIT:  Current Discharge Medication List       Note:  This document was prepared using Dragon voice recognition software and may include unintentional dictation errors.   Evangeline Dakin, PA-C 03/13/16 1555    Jene Every, MD 03/13/16 [phone]

## 2016-03-13 NOTE — ED Notes (Signed)
Denies needing wheelchair offered.

## 2016-03-15 ENCOUNTER — Encounter: Payer: Self-pay | Admitting: Emergency Medicine

## 2016-03-15 ENCOUNTER — Emergency Department
Admission: EM | Admit: 2016-03-15 | Discharge: 2016-03-15 | Disposition: A | Discharge status: Home / Self Care | Payer: BLUE CROSS/BLUE SHIELD | Attending: Emergency Medicine | Admitting: Emergency Medicine

## 2016-03-15 DIAGNOSIS — Y939 Activity, unspecified: Secondary | ICD-10-CM | POA: Insufficient documentation

## 2016-03-15 DIAGNOSIS — Y929 Unspecified place or not applicable: Secondary | ICD-10-CM | POA: Insufficient documentation

## 2016-03-15 DIAGNOSIS — Y999 Unspecified external cause status: Secondary | ICD-10-CM | POA: Insufficient documentation

## 2016-03-15 DIAGNOSIS — W010XXA Fall on same level from slipping, tripping and stumbling without subsequent striking against object, initial encounter: Secondary | ICD-10-CM | POA: Insufficient documentation

## 2016-03-15 DIAGNOSIS — M25562 Pain in left knee: Secondary | ICD-10-CM | POA: Insufficient documentation

## 2016-03-15 DIAGNOSIS — F17210 Nicotine dependence, cigarettes, uncomplicated: Secondary | ICD-10-CM | POA: Insufficient documentation

## 2016-03-15 MED ORDER — MELOXICAM 7.5 MG PO TABS: 7.5000 mg | ORAL_TABLET | Freq: Every day | ORAL | 1 refills | Status: AC

## 2016-03-15 NOTE — ED Provider Notes (Signed)
[facility] Emergency Department Provider Note  ____________________________________________  Time seen: Approximately 8:44 PM  I have reviewed the triage vital signs and the nursing notes.   HISTORY  Chief Complaint Knee Pain    HPI Andrew Hanna is a 26 y.o. male with a history of ACL reconstruction presenting to the emergency department with left knee pain. Patient currently rates pain at 6/10 in intensity and describes it as aching. Patient states that he slipped and fell on ice on 03/13/2016. Patient [facility]. DG left knee did not reveal acute bony abnormalities during this encounter. Patient denies traumas or falls since he was last seen in the emergency department. He states that he woke up this morning with left knee swelling. He presents to the emergency department for reassurance. His appointment with orthopedics is scheduled for 03/24/2016. Patient has not attempted alleviating measures as patient is apprehensive from prior substance abuse.    History reviewed. No pertinent past medical history.  There are no active problems to display for this patient.   Past Surgical History:  Procedure Laterality Date  . FINGER AMPUTATION      Prior to Admission medications   Medication Sig Start Date End Date Taking? Authorizing Provider  meloxicam (MOBIC) 7.5 MG tablet Take 1 tablet (7.5 mg total) by mouth daily. 03/15/16 03/22/16  Orvil FeilJaclyn M Woods, PA-C    Allergies Patient has no known allergies.  History reviewed. No pertinent family history.  Social History Social History  Substance Use Topics  . Smoking status: Current Every Day Smoker    Packs/day: 1.00    Types: Cigarettes  . Smokeless tobacco: Former NeurosurgeonUser  . Alcohol use No     Comment: occasional    Review of Systems  Cardiovascular: no chest pain. Respiratory: no cough. No SOB. Musculoskeletal: Patient has left knee pain.   Skin: Negative for rash, abrasions, lacerations, ecchymosis. Neurological: Negative for headaches, focal weakness or numbness. ____________________________________________   PHYSICAL EXAM:  VITAL SIGNS: ED Triage Vitals [03/15/16 1851]  Enc Vitals Group     BP (!) 149/77     Pulse Rate 82     Resp 18     Temp 98.2 F (36.8 C)     Temp Source Oral     SpO2 97 %     Weight 170 lb (77.1 kg)     Height 6\' 4"  (1.93 m)     Head Circumference      Peak Flow      Pain Score 9     Pain Loc      Pain Edu?      Excl. in GC?      Constitutional: Alert and oriented. Well appearing and in no acute distress. Neck: No stridor. FROM.  Cardiovascular: Normal rate, regular rhythm. Normal S1 and S2.  Good peripheral circulation. Respiratory: Normal respiratory effort without tachypnea or retractions. Lungs CTAB. Good air entry to the bases with no decreased or absent breath sounds. Musculoskeletal: Patient has 5 out of 5 strength in the lower extremities bilaterally. To inspection, knees are symmetrical. Peripatellar dimpling is visualized at left knee. Negative ballottement, left. Patient is able to perform full range of motion at the left knee, left ankle and left hip. Negative anterior and posterior drawer test, left. Palpable dorsalis pedis pulse bilaterally and symmetrically. Neurologic:  Normal speech and language. No gross focal neurologic deficits are appreciated. Reflexes are 2+ and symmetric in the lower  extremities bilaterally. Skin:  Prior incision sites from anterior cruciate ligament reconstruction are visualized. Psychiatric: Mood and affect are normal. Speech and behavior are normal. Patient exhibits appropriate insight and judgement.   ____________________________________________   LABS (all labs ordered are listed, but only abnormal results are displayed)  Labs Reviewed - No data to  display ____________________________________________  EKG   ____________________________________________  RADIOLOGY   No results found.  ____________________________________________    PROCEDURES  Procedure(s) performed:    Procedures    Medications - No data to display   ____________________________________________   INITIAL IMPRESSION / ASSESSMENT AND PLAN / ED COURSE  Pertinent labs & imaging results that were available during my care of the patient were reviewed by me and considered in my medical decision making (see chart for details).  Review of the [place] CSRS was performed in accordance of the [facility] prior to dispensing any controlled drugs.    Assessment and plan: Left knee pain: Patient presents to the emergency department with left knee pain. He denies new incidences of falls or traumas since being seen at [facility] ED on 03/13/2016. Physical exam is reassuring at this time.  Repeat X-rays of the left knee are not warranted at this time. Patient was encouraged to keep his appointment with orthopedics for 03/24/2016. He was discharged with Mobic to be used for pain and inflammation. All patient questions were answered. ____________________________________________  FINAL CLINICAL IMPRESSION(S) / ED DIAGNOSES  Final diagnoses:  Acute pain of left knee      NEW MEDICATIONS STARTED DURING THIS VISIT:  New Prescriptions   MELOXICAM (MOBIC) 7.5 MG TABLET    Take 1 tablet (7.5 mg total) by mouth daily.        This chart was dictated using voice recognition software/Dragon. Despite best efforts to proofread, errors can occur which can change the meaning. Any change was purely unintentional.    Orvil Feil, PA-C 03/16/16 1610    Minna Antis, MD 03/19/16 [phone]

## 2016-03-15 NOTE — ED Notes (Signed)
Left knee pain and swelling since slipping on ice. Pt seen in ER Saturday and has xray. Pt states he could not go to work today and was told to come back to doctor for note.

## 2016-03-15 NOTE — ED Notes (Signed)
Pt alert and oriented X4, active, cooperative, pt in NAD. RR even and unlabored, color WNL.  Pt informed to return if any life threatening symptoms occur.   

## 2016-03-15 NOTE — ED Triage Notes (Signed)
Pt fell on ice Friday; recent knee surgery. Apparently injured knee. Was seen here on Saturday but it is worse today - swollen, more painful. Pt alert & oriented with NAD noted.

## 2016-03-22 ENCOUNTER — Emergency Department
Admission: EM | Admit: 2016-03-22 | Discharge: 2016-03-22 | Disposition: A | Discharge status: Home / Self Care | Payer: BLUE CROSS/BLUE SHIELD | Attending: Emergency Medicine | Admitting: Emergency Medicine

## 2016-03-22 ENCOUNTER — Encounter: Payer: Self-pay | Admitting: Emergency Medicine

## 2016-03-22 DIAGNOSIS — R591 Generalized enlarged lymph nodes: Secondary | ICD-10-CM

## 2016-03-22 DIAGNOSIS — R51 Headache: Secondary | ICD-10-CM | POA: Insufficient documentation

## 2016-03-22 DIAGNOSIS — Z791 Long term (current) use of non-steroidal anti-inflammatories (NSAID): Secondary | ICD-10-CM | POA: Insufficient documentation

## 2016-03-22 DIAGNOSIS — F17210 Nicotine dependence, cigarettes, uncomplicated: Secondary | ICD-10-CM | POA: Insufficient documentation

## 2016-03-22 LAB — CBC WITH DIFFERENTIAL/PLATELET
BASOS ABS: 0.1 10*3/uL (ref 0–0.1)
BASOS PCT: 1 %
Eosinophils Absolute: 0.2 10*3/uL (ref 0–0.7)
Eosinophils Relative: 3 %
HEMATOCRIT: 41.9 % (ref 40.0–52.0)
Hemoglobin: 14.4 g/dL (ref 13.0–18.0)
Lymphocytes Relative: 21 %
Lymphs Abs: 1.3 10*3/uL (ref 1.0–3.6)
MCH: 30.8 pg (ref 26.0–34.0)
MCHC: 34.5 g/dL (ref 32.0–36.0)
MCV: 89.5 fL (ref 80.0–100.0)
Monocytes Absolute: 0.7 10*3/uL (ref 0.2–1.0)
Monocytes Relative: 12 %
NEUTROS ABS: 4 10*3/uL (ref 1.4–6.5)
Neutrophils Relative %: 63 %
Platelets: 151 10*3/uL (ref 150–440)
RBC: 4.68 MIL/uL (ref 4.40–5.90)
RDW: 12.5 % (ref 11.5–14.5)
WBC: 6.3 10*3/uL (ref 3.8–10.6)

## 2016-03-22 LAB — BASIC METABOLIC PANEL
ANION GAP: 5 (ref 5–15)
BUN: 11 mg/dL (ref 6–20)
CALCIUM: 8.6 mg/dL — AB (ref 8.9–10.3)
CO2: 26 mmol/L (ref 22–32)
Chloride: 106 mmol/L (ref 101–111)
Creatinine, Ser: 0.78 mg/dL (ref 0.61–1.24)
GFR calc non Af Amer: >60 mL/min (ref >60)
GLUCOSE: 96 mg/dL (ref 65–99)
POTASSIUM: 4.4 mmol/L (ref 3.5–5.1)
Sodium: 137 mmol/L (ref 135–145)

## 2016-03-22 MED ORDER — IBUPROFEN 600 MG PO TABS
600.0000 mg | ORAL_TABLET | Freq: Once | ORAL | Status: AC
  Administered 2016-03-22: 600 mg via ORAL
  Filled 2016-03-22: qty 1

## 2016-03-22 MED ORDER — CIPROFLOXACIN HCL 500 MG PO TABS: 500.0000 mg | ORAL_TABLET | Freq: Two times a day (BID) | ORAL | 0 refills | Status: DC

## 2016-03-22 NOTE — Discharge Instructions (Signed)
[facility] if any [facility]. Continue taking Tylenol or ibuprofen as needed for headache or body ache. Begin taking Cipro for the next 7 days

## 2016-03-22 NOTE — ED Notes (Signed)
See triage note  States he has had a headache for about 6 days   And feels some swelling under right arm  Afebrile on arrival

## 2016-03-22 NOTE — ED Triage Notes (Signed)
Pt states "I think I have cat scratch fever". Pt unable to locate exact cat scratch, reports headache x6 days and swelling to lymphnode under right arm.

## 2016-03-22 NOTE — ED Provider Notes (Signed)
[facility] Emergency Department Provider Note  ____________________________________________   First MD Initiated Contact with Patient 03/22/16 1133     (approximate)  I have reviewed the triage vital signs and the nursing notes.   HISTORY  Chief Complaint Lymphadenopathy and Headache    HPI Andrew Hanna is a 26 y.o. male is here with complaint of headache for 6 days along with a lymph node under his right arm. Patient states that he believes he has "cat scratch fever". Patient states that there is a cat in the home that is constantly scratching him or biting him. 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 He denies any areas that have been red or has been draining. He is unaware of any fever or chills. He also complains of a generalized headache. There is been no visual changes, nausea or vomiting with this. He has not been taking any over-the-counter medication. Currently he rates his pain as an 8 out of 10.   History reviewed. No pertinent past medical history.  There are no active problems to display for this patient.   Past Surgical History:  Procedure Laterality Date  . FINGER AMPUTATION      Prior to Admission medications   Medication Sig Start Date End Date Taking? Authorizing Provider  ciprofloxacin (CIPRO) 500 MG tablet Take 1 tablet (500 mg total) by mouth 2 (two) times daily. 03/22/16   Tommi Rumps, PA-C  meloxicam (MOBIC) 7.5 MG tablet Take 1 tablet (7.5 mg total) by mouth daily. 03/15/16 03/22/16  Orvil Feil, PA-C    Allergies Patient has no known allergies.  No family history on file.  Social History Social History  Substance Use Topics  . Smoking status: Current Every Day Smoker    Packs/day: 1.00    Types: Cigarettes  . Smokeless tobacco: Former Neurosurgeon  . Alcohol use No     Comment: occasional    Review of Systems Constitutional: No fever/chills ENT: No sore  throat. Cardiovascular: Denies chest pain. Respiratory: Denies shortness of breath. Gastrointestinal:  No nausea, no vomiting.  Musculoskeletal: Negative for back pain. Positive tenderness right axilla. Skin: Negative for rash. Neurological: Negative for headaches, focal weakness or numbness.  10-point ROS otherwise negative.  ____________________________________________   PHYSICAL EXAM:  VITAL SIGNS: ED Triage Vitals [03/22/16 1052]  Enc Vitals Group     BP 123/78     Pulse Rate 88     Resp 16     Temp 98 F (36.7 C)     Temp Source Oral     SpO2 99 %     Weight 165 lb (74.8 kg)     Height 6\' 4"  (1.93 m)     Head Circumference      Peak Flow      Pain Score 8     Pain Loc      Pain Edu?      Excl. in GC?     Constitutional: Alert and oriented. Well appearing and in no acute distress. Eyes: Conjunctivae are normal. PERRL. EOMI. Head: Atraumatic. Nose: No congestion/rhinnorhea. Mouth/Throat: Mucous membranes are moist.  Oropharynx non-erythematous. Neck: No stridor.  Hematological/Lymphatic/Immunilogical: No cervical lymphadenopathy. Questionable single lymphadenopathy right axilla. Cardiovascular: Normal rate, regular rhythm. Grossly normal heart sounds.  Good peripheral circulation. Respiratory: Normal respiratory effort.  No retractions. Lungs CTAB. Gastrointestinal: Soft and nontender. No distention.  Musculoskeletal: Upper and lower extremities without any difficulty. Normal  gait was noted. Neurologic:  Normal speech and language. No gross focal neurologic deficits are appreciated. No gait instability. Skin:  Skin is warm, dry. Eye examination of the right upper extremity there is no areas of erythema, purulent drainage, tenderness or pain on palpation. There are several superficial punctures but no actual scratches were seen on today's visit of either extremity Psychiatric: Mood and affect are normal. Speech and behavior are  normal.  ____________________________________________   LABS (all labs ordered are listed, but only abnormal results are displayed)  Labs Reviewed  BASIC METABOLIC PANEL - Abnormal; Notable for the following:       Result Value   Calcium 8.6 (*)    All other components within normal limits  CBC WITH DIFFERENTIAL/PLATELET    PROCEDURES  Procedure(s) performed: None  Procedures  Critical Care performed: No  ____________________________________________   INITIAL IMPRESSION / ASSESSMENT AND PLAN / ED COURSE  Pertinent labs & imaging results that were available during my care of the patient were reviewed by me and considered in my medical decision making (see chart for details).  Patient is [facility] if any continued problems. He was placed on Cipro 500 mg twice a day for 7 days for his lymphadenopathy and to  follow-up if there is any continued swelling or pain. Patient is to take Tylenol or ibuprofen as needed for pain.     ___________________________________________   FINAL CLINICAL IMPRESSION(S) / ED DIAGNOSES  Final diagnoses:  Lymphadenopathy      NEW MEDICATIONS STARTED DURING THIS VISIT:  Discharge Medication List as of 03/22/2016  1:03 PM    START taking these medications   Details  ciprofloxacin (CIPRO) 500 MG tablet Take 1 tablet (500 mg total) by mouth 2 (two) times daily., Starting Mon 03/22/2016, Print         Note:  This document was prepared using Dragon voice recognition software and may include unintentional dictation errors.    Tommi RumpsRhonda L Summers, PA-C 03/22/16 1410    Jene Everyobert Kinner, MD 03/22/16 1414

## 2016-03-31 ENCOUNTER — Emergency Department
Admission: EM | Admit: 2016-03-31 | Discharge: 2016-03-31 | Disposition: A | Discharge status: Home / Self Care | Payer: BLUE CROSS/BLUE SHIELD | Attending: Student in an Organized Health Care Education/Training Program | Admitting: Student in an Organized Health Care Education/Training Program

## 2016-03-31 ENCOUNTER — Encounter: Payer: Self-pay | Admitting: Emergency Medicine

## 2016-03-31 DIAGNOSIS — J111 Influenza due to unidentified influenza virus with other respiratory manifestations: Secondary | ICD-10-CM | POA: Insufficient documentation

## 2016-03-31 DIAGNOSIS — F17210 Nicotine dependence, cigarettes, uncomplicated: Secondary | ICD-10-CM | POA: Insufficient documentation

## 2016-03-31 MED ORDER — OSELTAMIVIR PHOSPHATE 75 MG PO CAPS: 75.0000 mg | ORAL_CAPSULE | Freq: Two times a day (BID) | ORAL | 0 refills | Status: DC

## 2016-03-31 MED ORDER — FLUTICASONE PROPIONATE 50 MCG/ACT NA SUSP: 2.0000 | Freq: Every day | NASAL | 0 refills | Status: DC

## 2016-03-31 MED ORDER — PROMETHAZINE-DM 6.25-15 MG/5ML PO SYRP: 5.0000 mL | ORAL_SOLUTION | Freq: Four times a day (QID) | ORAL | 0 refills | Status: DC | PRN

## 2016-03-31 NOTE — ED Provider Notes (Signed)
[facility] Emergency Department Provider Note  ____________________________________________  Time seen: Approximately 11:08 AM  I have reviewed the triage vital signs and the nursing notes.   HISTORY  Chief Complaint Cough; Fever; and Generalized Body Aches    HPI Andrew Hanna is a 26 y.o. male , NAD, presents to the emergency department with one-day history of flulike illness.Patient states he had sudden onset of fever, chills, body aches, cough, sore throat and chest congestion as of yesterday. Has been taking over-the-counter medications which seemed to help with his symptoms for a short amount of time. Denies any sick contacts. Has had no headaches, sinus pressure. Denies any ear pain or drainage from the ears. Has had no abdominal pain but does endorse nausea without vomiting. No diarrhea. Has not noted any rashes.   History reviewed. No pertinent past medical history.  There are no active problems to display for this patient.   Past Surgical History:  Procedure Laterality Date  . FINGER AMPUTATION      Prior to Admission medications   Medication Sig Start Date End Date Taking? Authorizing Provider  fluticasone (FLONASE) 50 MCG/ACT nasal spray Place 2 sprays into both nostrils daily. 03/31/16   Tannie Koskela L Telma Pyeatt, PA-C  oseltamivir (TAMIFLU) 75 MG capsule Take 1 capsule (75 mg total) by mouth 2 (two) times daily. 03/31/16   Malayna Noori L Nanako Stopher, PA-C  promethazine-dextromethorphan (PROMETHAZINE-DM) 6.25-15 MG/5ML syrup Take 5 mLs by mouth 4 (four) times daily as needed for cough. 03/31/16   Kamori Barbier L Shirl Ludington, PA-C    Allergies Patient has no known allergies.  History reviewed. No pertinent family history.  Social History Social History  Substance Use Topics  . Smoking status: Current Every Day Smoker    Packs/day: 1.00    Types: Cigarettes  . Smokeless tobacco: Former Neurosurgeon  . Alcohol use No     Comment: occasional     Review of Systems  Constitutional:  Positive fever, chills, fatigue. Eyes: No visual changes. No discharge ENT: Positive sore throat, nasal congestion, runny nose. No ear pain, ear drainage. Cardiovascular: No chest pain. Respiratory: Positive cough, chest congestion. No shortness of breath. No wheezing.  Gastrointestinal: Positive nausea without vomiting. No abdominal pain.  No diarrhea.  Musculoskeletal: Positive for general myalgias.  Skin: Negative for rash. Neurological: Negative for headaches, focal weakness or numbness. 10-point ROS otherwise negative.  ____________________________________________   PHYSICAL EXAM:  VITAL SIGNS: ED Triage Vitals  Enc Vitals Group     BP 03/31/16 0958 127/81     Pulse Rate 03/31/16 0958 89     Resp 03/31/16 0958 18     Temp 03/31/16 0958 98.7 F (37.1 C)     Temp Source 03/31/16 0958 Oral     SpO2 03/31/16 0958 97 %     Weight 03/31/16 0959 165 lb (74.8 kg)     Height 03/31/16 0959 6\' 4"  (1.93 m)     Head Circumference --      Peak Flow --      Pain Score 03/31/16 0959 10     Pain Loc --      Pain Edu? --      Excl. in GC? --      Constitutional: Alert and oriented. Well appearing and in no acute distress. Eyes: Conjunctivae are normal Without icterus, injection or discharge. Head: Atraumatic. ENT:      Ears: TMs visualized bilaterally without erythema, effusion, bulging, perforation.      Nose: Congestion with clear rhinorrhea.  Mouth/Throat: Mucous membranes are moist. Pharynx without erythema, swelling, exudate. Uvula is midline. Airways patent. Clear postnasal drainage. Neck: No stridor. Supple with full range of motion. Hematological/Lymphatic/Immunilogical: No cervical lymphadenopathy. Cardiovascular: Normal rate, regular rhythm. Normal S1 and S2.  Good peripheral circulation. Respiratory: Normal respiratory effort without tachypnea or retractions. Lungs CTAB breath sounds noted in all lung fields. No wheeze, rhonchi, rales Neurologic:  Normal speech and  language. No gross focal neurologic deficits are appreciated.  Skin:  Skin is warm, dry and intact. No rash noted. Psychiatric: Mood and affect are normal. Speech and behavior are normal. Patient exhibits appropriate insight and judgement.   ____________________________________________   LABS  None ____________________________________________  EKG  None ____________________________________________  RADIOLOGY  None ____________________________________________    PROCEDURES  Procedure(s) performed: None   Procedures   Medications - No data to display   ____________________________________________   INITIAL IMPRESSION / ASSESSMENT AND PLAN / ED COURSE  Pertinent labs & imaging results that were available during my care of the patient were reviewed by me and considered in my medical decision making (see chart for details).     Patient's diagnosis is consistent with influenza. Patient will be discharged home with prescriptions for Tamiflu, promethazine DM and Flonase to take as directed. May continue over-the-counter Tylenol or ibuprofen as needed. Patient is to [facility] if symptoms persist past this treatment course. Patient is given ED precautions to return to the ED for any worsening or new symptoms.   ____________________________________________  FINAL CLINICAL IMPRESSION(S) / ED DIAGNOSES  Final diagnoses:  Influenza      NEW MEDICATIONS STARTED DURING THIS VISIT:  Discharge Medication List as of 03/31/2016 11:10 AM    START taking these medications   Details  fluticasone (FLONASE) 50 MCG/ACT nasal spray Place 2 sprays into both nostrils daily., Starting Wed 03/31/2016, Print    oseltamivir (TAMIFLU) 75 MG capsule Take 1 capsule (75 mg total) by mouth 2 (two) times daily., Starting Wed 03/31/2016, Print    promethazine-dextromethorphan (PROMETHAZINE-DM) 6.25-15 MG/5ML syrup Take 5 mLs by mouth 4 (four) times daily as needed for  cough., Starting Wed 03/31/2016, Print             Ernestene KielJami L BethesdaHagler, PA-C 03/31/16 1731    Willy EddyPatrick Robinson, MD 04/09/16 1500

## 2016-03-31 NOTE — ED Triage Notes (Signed)
Pt presents to ED with c/o flu like symptoms of cough, nasal congestion, fever, generalized body aches since yesterday. Pt is alert and oriented at this time.

## 2016-03-31 NOTE — ED Notes (Addendum)
See triage note  Developed body aches fever and cough since yesterday  Afebrile on arrival  States he recently was seen and given antibiotics for cat scratch fever

## 2016-10-11 ENCOUNTER — Encounter: Payer: Self-pay | Admitting: Emergency Medicine

## 2016-10-11 ENCOUNTER — Emergency Department
Admission: EM | Admit: 2016-10-11 | Discharge: 2016-10-11 | Disposition: A | Discharge status: Home / Self Care | Payer: BLUE CROSS/BLUE SHIELD | Attending: Emergency Medicine | Admitting: Emergency Medicine

## 2016-10-11 DIAGNOSIS — Z79899 Other long term (current) drug therapy: Secondary | ICD-10-CM | POA: Insufficient documentation

## 2016-10-11 DIAGNOSIS — F17210 Nicotine dependence, cigarettes, uncomplicated: Secondary | ICD-10-CM | POA: Insufficient documentation

## 2016-10-11 DIAGNOSIS — K0889 Other specified disorders of teeth and supporting structures: Secondary | ICD-10-CM | POA: Insufficient documentation

## 2016-10-11 NOTE — ED Provider Notes (Signed)
[facility] Emergency Department Provider Note  ____________________________________________  Time seen: Approximately 10:10 PM  I have reviewed the triage vital signs and the nursing notes.   HISTORY  Chief Complaint Dental Pain    HPI Andrew Hanna is a 26 y.o. male presenting to the emergency department with 9/10 superior 2 pain. Patient states that he has been grinding his teeth at night and chipped a piece of his tooth off last night, 10/10/2016. Patient states that he "not here for pain medicine." Patient states that he would just like a work note. He has noticed no fullness of the upper or lower jaw, fever, chills, nausea or vomiting.   History reviewed. No pertinent past medical history.  There are no active problems to display for this patient.   Past Surgical History:  Procedure Laterality Date  . FINGER AMPUTATION    . KNEE SURGERY Left     Prior to Admission medications   Medication Sig Start Date End Date Taking? Authorizing Provider  fluticasone (FLONASE) 50 MCG/ACT nasal spray Place 2 sprays into both nostrils daily. 03/31/16   Hagler, Jami L, PA-C  oseltamivir (TAMIFLU) 75 MG capsule Take 1 capsule (75 mg total) by mouth 2 (two) times daily. 03/31/16   Hagler, Jami L, PA-C  promethazine-dextromethorphan (PROMETHAZINE-DM) 6.25-15 MG/5ML syrup Take 5 mLs by mouth 4 (four) times daily as needed for cough. 03/31/16   Hagler, Jami L, PA-C    Allergies Patient has no known allergies.  No family history on file.  Social History Social History  Substance Use Topics  . Smoking status: Current Every Day Smoker    Packs/day: 1.00    Types: Cigarettes  . Smokeless tobacco: Former Neurosurgeon  . Alcohol use No     Comment: occasional     Review of Systems  Constitutional: No fever/chills Eyes: No visual changes. No discharge ENT: Patient has Superior 2 pain. Cardiovascular: no chest pain. Respiratory: no cough. No SOB. Gastrointestinal: No  abdominal pain.  No nausea, no vomiting.  No diarrhea.  No constipation. Musculoskeletal: Negative for musculoskeletal pain. Skin: Negative for rash, abrasions, lacerations, ecchymosis. Neurological: Negative for headaches, focal weakness or numbness.   ____________________________________________   PHYSICAL EXAM:  VITAL SIGNS: ED Triage Vitals [10/11/16 2028]  Enc Vitals Group     BP 135/87     Pulse Rate 85     Resp 20     Temp 98.9 F (37.2 C)     Temp Source Oral     SpO2 97 %     Weight 165 lb (74.8 kg)     Height 6\' 4"  (1.93 m)     Head Circumference      Peak Flow      Pain Score 9     Pain Loc      Pain Edu?      Excl. in GC?      Constitutional: Alert and oriented. Well appearing and in no acute distress. Eyes: Conjunctivae are normal. PERRL. EOMI. Head: Atraumatic.      Mouth/Throat: Mucous membranes are moist. Patient has superior 2 pain. No palpable edema of the jaw. Neck: No stridor. FROM. Cardiovascular: Normal rate, regular rhythm. Normal S1 and S2.  Good peripheral circulation. Respiratory: Normal respiratory effort without tachypnea or retractions. Lungs CTAB. Good air entry to the bases with no decreased or absent breath sounds. Skin:  Skin is warm, dry and intact. No rash noted. Psychiatric: Mood and affect are normal. Speech and behavior are  normal. Patient exhibits appropriate insight and judgement.   ____________________________________________   LABS (all labs ordered are listed, but only abnormal results are displayed)  Labs Reviewed - No data to display ____________________________________________  EKG   ____________________________________________  RADIOLOGY  No results found.  ____________________________________________    PROCEDURES  Procedure(s) performed:    Procedures    Medications - No data to display   ____________________________________________   INITIAL IMPRESSION / ASSESSMENT AND PLAN / ED  COURSE  Pertinent labs & imaging results that were available during my care of the patient were reviewed by me and considered in my medical decision making (see chart for details).  Review of the [place] CSRS was performed in accordance of the [facility] prior to dispensing any controlled drugs.     Assessment and plan Dental pain Patient presents to the emergency department with dental pain from a superior 2. Patient is just requesting a work note. Patient declined pain medication in the emergency department. Patient was advised to make an appointment with a local dentist as soon as possible. All patient questions were answered.  ____________________________________________  FINAL CLINICAL IMPRESSION(S) / ED DIAGNOSES  Final diagnoses:  Pain, dental      NEW MEDICATIONS STARTED DURING THIS VISIT:  New Prescriptions   No medications on file        This chart was dictated using voice recognition software/Dragon. Despite best efforts to proofread, errors can occur which can change the meaning. Any change was purely unintentional.    Gasper Lloyd 10/11/16 2221    Sharman Cheek, MD 10/11/16 [id number]

## 2016-10-11 NOTE — Discharge Instructions (Signed)
OPTIONS FOR DENTAL FOLLOW UP CARE ° °[place] Department of Health and Human Services - Local Safety Net Dental Clinics °http://www.ncdhhs.gov/dph/oralhealth/services/safetynetclinics.htm °  °[facility] (336-562-3123) ° °[facility] [place] (919-933-9087) ° °[facility] ([phone] ext [phone]) ° °[facility] ([phone]) ° °[facility] ([phone]) °This clinic caters to the indigent population and is on a lottery system. °Location: °[facility], [address], [place] °Clinic Hours: °Wednesdays from 6pm - 9pm, patients seen by a lottery system. °For dates, call or go to www.med.[facility].edu/[facility]/patients/Dental-[facility] °Services: °Cleanings, fillings and simple extractions. °Payment Options: °DENTAL WORK IS FREE OF CHARGE. Bring proof of income or support. °Best way to get seen: °Arrive at 5:15 pm - this is a lottery, NOT first come/first serve, so arriving earlier will not increase your chances of being seen. °  °  °[facility] °[phone] °Select option 1 for emergencies °  °Location: °[facility], [address], [place] °Clinic Hours: °No walk-ins accepted - call the day before to schedule an appointment. °Check in times are 9:30 am and 1:30 pm. °Services: °Simple extractions, temporary fillings, pulpectomy/pulp debridement, uncomplicated abscess drainage. °Payment Options: °PAYMENT IS DUE AT THE TIME OF SERVICE.  Fee is usually $100-200, additional surgical procedures (e.g. abscess drainage) may be extra. °Cash, checks, Visa/MasterCard accepted.  Can file Medicaid if patient is covered for dental - patient should call case worker to check. °No discount for [facility] patients. °Best way to get seen: °MUST call the day before and get onto the schedule. Can usually be seen the next 1-2 days. No walk-ins accepted. °  °  °[facility] °[phone] °   °Location: °[facility], [address], [place] °Clinic Hours: °M, W, Th, F 8am or 1:30pm, Tues 9a or 1:30 - first come/first served. °Services: °Simple extractions, temporary fillings, uncomplicated abscess drainage.  You do not need to be an [place] resident. °Payment Options: °PAYMENT IS DUE AT THE TIME OF SERVICE. °Dental insurance, otherwise sliding scale - bring proof of income or support. °Depending on income and treatment needed, cost is usually $50-200. °Best way to get seen: °Arrive early as it is first come/first served. °  °  °[facility] °[phone] °  °Location: °[address] °Clinic Hours: °Mon-Thu 8a-5p °Services: °Most basic dental services including extractions and fillings. °Payment Options: °PAYMENT IS DUE AT THE TIME OF SERVICE. °Sliding scale, up to 50% off - bring proof if income or support. °Medicaid with dental option accepted. °Best way to get seen: °Call to schedule an appointment, can usually be seen within 2 weeks OR they will try to see walk-ins - show up at 8a or 2p (you may have to wait). °  °  °[facility] °919-245-2435 °[place] RESIDENTS ONLY °  °Location: °[facility], [address], [place], [place] [address] °Clinic Hours: By appointment only. °Monday - Thursday 8am-5pm, Friday 8am-12pm °Services: Cleanings, fillings, extractions. °Payment Options: °PAYMENT IS DUE AT THE TIME OF SERVICE. °Cash, Visa or MasterCard. Sliding scale - $30 minimum per service. °Best way to get seen: °Come in to office, complete packet and make an appointment - need proof of income °or support monies for each household member and proof of [place] residence. °Usually takes about a month to get in. °  °  °[facility] °[phone] °  °Location: °[address]   [place] °Clinic Hours: Walk-in Urgent Care Dental Services are offered Monday-Friday  mornings only. °The numbers of emergencies accepted daily is limited to the number of °providers available. °Maximum 15 - Mondays, Wednesdays & Thursdays °Maximum 10 - Tuesdays & Fridays °Services: °You do not need to be a [place] resident to be seen for a dental emergency. °Emergencies are defined as pain, swelling, abnormal bleeding, or dental trauma. Walkins will receive x-rays if needed. °NOTE: Dental cleaning is not an emergency. °Payment Options: °PAYMENT IS DUE AT THE TIME OF SERVICE. °Minimum co-pay is $40.00 for uninsured patients. °Minimum co-pay is $3.00 for Medicaid with dental coverage. °Dental Insurance is accepted and must be presented at time of visit. °Medicare does not cover dental. °Forms of payment: Cash, credit card, checks. °Best way to get seen: °If not previously registered with the clinic, walk-in dental registration begins at 7:15 am and is on a first come/first serve basis. °If previously registered with the clinic, call to make an appointment. °  °  °[facility] °[phone] °[place] RESIDENTS ONLY °  °Location: °[address], [place], [place] °Clinic Hours: °Mon-Thu 10a-2p °Services: Extractions only! °Payment Options: °FREE (donations accepted) - bring proof of income or support °Best way to get seen: °Call and schedule an appointment OR come at 8am on the 1st Monday of every month (except for holidays) when it is first come/first served. °  °  °[facility] °[phone] °  °Location: °[address], [place] °Clinic Hours: °Friday mornings °Services, Payment Options, Best way to get seen: °Call for info °

## 2016-10-11 NOTE — ED Triage Notes (Signed)
Patient ambulatory to triage with steady gait, without difficulty or distress noted; pt reports right upper dental pain since last night

## 2017-01-03 ENCOUNTER — Other Ambulatory Visit: Payer: Self-pay

## 2017-01-03 ENCOUNTER — Encounter: Payer: Self-pay | Admitting: Emergency Medicine

## 2017-01-03 ENCOUNTER — Emergency Department
Admission: EM | Admit: 2017-01-03 | Discharge: 2017-01-03 | Disposition: A | Discharge status: Home / Self Care | Payer: Self-pay | Attending: Emergency Medicine | Admitting: Emergency Medicine

## 2017-01-03 DIAGNOSIS — F17210 Nicotine dependence, cigarettes, uncomplicated: Secondary | ICD-10-CM | POA: Insufficient documentation

## 2017-01-03 DIAGNOSIS — R112 Nausea with vomiting, unspecified: Secondary | ICD-10-CM | POA: Insufficient documentation

## 2017-01-03 DIAGNOSIS — Z79899 Other long term (current) drug therapy: Secondary | ICD-10-CM | POA: Insufficient documentation

## 2017-01-03 LAB — COMPREHENSIVE METABOLIC PANEL
ALBUMIN: 4.4 g/dL (ref 3.5–5.0)
ALT: 45 U/L (ref 17–63)
AST: 37 U/L (ref 15–41)
Alkaline Phosphatase: 58 U/L (ref 38–126)
Anion gap: 9 (ref 5–15)
BILIRUBIN TOTAL: 0.8 mg/dL (ref 0.3–1.2)
BUN: 17 mg/dL (ref 6–20)
CO2: 24 mmol/L (ref 22–32)
Calcium: 9.4 mg/dL (ref 8.9–10.3)
Chloride: 105 mmol/L (ref 101–111)
Creatinine, Ser: 0.88 mg/dL (ref 0.61–1.24)
GFR calc Af Amer: >60 mL/min (ref >60)
Glucose, Bld: 123 mg/dL — ABNORMAL HIGH (ref 65–99)
POTASSIUM: 4.1 mmol/L (ref 3.5–5.1)
SODIUM: 138 mmol/L (ref 135–145)
Total Protein: 7.4 g/dL (ref 6.5–8.1)

## 2017-01-03 LAB — LIPASE, BLOOD: LIPASE: 29 U/L (ref 11–51)

## 2017-01-03 LAB — URINALYSIS, COMPLETE (UACMP) WITH MICROSCOPIC
BACTERIA UA: NONE SEEN
BILIRUBIN URINE: NEGATIVE
Glucose, UA: NEGATIVE mg/dL
HGB URINE DIPSTICK: NEGATIVE
Ketones, ur: NEGATIVE mg/dL
Leukocytes, UA: NEGATIVE
NITRITE: NEGATIVE
PROTEIN: NEGATIVE mg/dL
SPECIFIC GRAVITY, URINE: 1.02 (ref 1.005–1.030)
SQUAMOUS EPITHELIAL / LPF: NONE SEEN
pH: 5 (ref 5.0–8.0)

## 2017-01-03 LAB — CBC
HEMATOCRIT: 47.5 % (ref 40.0–52.0)
HEMOGLOBIN: 15.9 g/dL (ref 13.0–18.0)
MCH: 30.8 pg (ref 26.0–34.0)
MCHC: 33.6 g/dL (ref 32.0–36.0)
MCV: 91.9 fL (ref 80.0–100.0)
Platelets: 188 10*3/uL (ref 150–440)
RBC: 5.16 MIL/uL (ref 4.40–5.90)
RDW: 12.7 % (ref 11.5–14.5)
WBC: 6.6 10*3/uL (ref 3.8–10.6)

## 2017-01-03 MED ORDER — ONDANSETRON 4 MG PO TBDP: 4.0000 mg | ORAL_TABLET | Freq: Three times a day (TID) | ORAL | 0 refills | Status: DC | PRN

## 2017-01-03 MED ORDER — ONDANSETRON 4 MG PO TBDP
4.0000 mg | ORAL_TABLET | Freq: Once | ORAL | Status: AC
  Administered 2017-01-03: 4 mg via ORAL
  Filled 2017-01-03: qty 1

## 2017-01-03 NOTE — Discharge Instructions (Signed)
Please follow up with the acute care clinic for further evaluation.  °

## 2017-01-03 NOTE — ED Notes (Signed)
Patient discharged to home per MD order. Patient in stable condition, and deemed medically cleared by ED provider for discharge. Discharge instructions reviewed with patient/family using "Teach Back"; verbalized understanding of medication education and administration, and information about follow-up care. Denies further concerns. ° °

## 2017-01-03 NOTE — ED Notes (Signed)
Pt given po fluids. 

## 2017-01-03 NOTE — ED Provider Notes (Signed)
[facility] Emergency Department Provider Note   ____________________________________________   First MD Initiated Contact with Patient 01/03/17 0408     (approximate)  I have reviewed the triage vital signs and the nursing notes.   HISTORY  Chief Complaint Emesis    HPI Andrew Hanna is a 26 y.o. male who comes into the hospital today with some nausea and vomiting.  The patient reports that he was at work tonight and he started to feel nauseous.  He went to the bathroom and then vomited twice.  He reports that he then told his manager that he was leaving to come here for evaluation.  He states that his emesis looks like stomach acid in his dinner.  He denies any abdominal pain or diarrhea.  The patient reports that at work many people have been coming and sick lately with vomiting symptoms.  The patient has not had any fevers.  He states that he still does feel little nauseous.    History reviewed. No pertinent past medical history.  There are no active problems to display for this patient.   Past Surgical History:  Procedure Laterality Date  . FINGER AMPUTATION    . KNEE SURGERY Left   . KNEE SURGERY Left     Prior to Admission medications   Medication Sig Start Date End Date Taking? Authorizing Provider  fluticasone (FLONASE) 50 MCG/ACT nasal spray Place 2 sprays into both nostrils daily. 03/31/16   Hagler, Jami L, PA-C  ondansetron (ZOFRAN ODT) 4 MG disintegrating tablet Take 1 tablet (4 mg total) every 8 (eight) hours as needed by mouth for nausea or vomiting. 01/03/17   Rebecka ApleyWebster, Daylin Gruszka P, MD  oseltamivir (TAMIFLU) 75 MG capsule Take 1 capsule (75 mg total) by mouth 2 (two) times daily. 03/31/16   Hagler, Jami L, PA-C  promethazine-dextromethorphan (PROMETHAZINE-DM) 6.25-15 MG/5ML syrup Take 5 mLs by mouth 4 (four) times daily as needed for cough. 03/31/16   Hagler, Jami L, PA-C    Allergies Patient has no known allergies.  No family history  on file.  Social History Social History   Tobacco Use  . Smoking status: Current Every Day Smoker    Packs/day: 1.00    Types: Cigarettes  . Smokeless tobacco: Former Engineer, waterUser  Substance Use Topics  . Alcohol use: Yes    Comment: occasional  . Drug use: No    Review of Systems  Constitutional: No fever/chills Eyes: No visual changes. ENT: No sore throat. Cardiovascular: Denies chest pain. Respiratory: Denies shortness of breath. Gastrointestinal: Nausea and vomiting with no abdominal pain.  No diarrhea.  No constipation. Genitourinary: Negative for dysuria. Musculoskeletal: Negative for back pain. Skin: Negative for rash. Neurological: Negative for headaches, focal weakness or numbness.    ____________________________________________   PHYSICAL EXAM:  VITAL SIGNS: ED Triage Vitals  Enc Vitals Group     BP 01/03/17 0317 136/89     Pulse Rate 01/03/17 0317 90     Resp 01/03/17 0317 18     Temp 01/03/17 0317 98.7 F (37.1 C)     Temp Source 01/03/17 0317 Oral     SpO2 01/03/17 0317 97 %     Weight 01/03/17 0317 165 lb (74.8 kg)     Height 01/03/17 0317 6\' 4"  (1.93 m)     Head Circumference --      Peak Flow --      Pain Score 01/03/17 0316 7     Pain Loc --  Pain Edu? --      Excl. in GC? --    Constitutional: Alert and oriented. Well appearing and in mild distress. Eyes: Conjunctivae are normal. PERRL. EOMI. Head: Atraumatic. Nose: No congestion/rhinnorhea. Mouth/Throat: Mucous membranes are moist.  Oropharynx non-erythematous. Cardiovascular: Normal rate, regular rhythm. Grossly normal heart sounds.  Good peripheral circulation. Respiratory: Normal respiratory effort.  No retractions. Lungs CTAB. Gastrointestinal: Soft and nontender. No distention.  Positive bowel sounds Musculoskeletal: No lower extremity tenderness nor edema.   Neurologic:  Normal speech and language.  Skin:  Skin is warm, dry and intact. Psychiatric: Mood and affect are normal.    ____________________________________________   LABS (all labs ordered are listed, but only abnormal results are displayed)  Labs Reviewed  COMPREHENSIVE METABOLIC PANEL - Abnormal; Notable for the following components:      Result Value   Glucose, Bld 123 (*)    All other components within normal limits  URINALYSIS, COMPLETE (UACMP) WITH MICROSCOPIC - Abnormal; Notable for the following components:   Color, Urine YELLOW (*)    APPearance CLEAR (*)    All other components within normal limits  LIPASE, BLOOD  CBC   ____________________________________________  EKG  none ____________________________________________  RADIOLOGY  No results found.  ____________________________________________   PROCEDURES  Procedure(s) performed: None  Procedures  Critical Care performed: No  ____________________________________________   INITIAL IMPRESSION / ASSESSMENT AND PLAN / ED COURSE  As part of my medical decision making, I reviewed the following data within the electronic MEDICAL RECORD NUMBER Notes from prior ED visits and [place] Controlled Substance Database   This is a 26 year old male who comes into the hospital today with some vomiting.  The patient reports that he just was not feeling well and had these episodes of vomiting.  My differential diagnosis includes nausea and vomiting of unknown etiology, gastroenteritis, colitis, pancreatitis, urinary tract infection.  The patient does not have any diarrhea nor does he have any abdominal pain.  We did check some blood work and his blood work was unremarkable.  He has no signs of pancreatitis and no signs of urinary tract infection.  I gave the patient a dose of Zofran and he was able to drink some ginger ale without difficulty.  He will be discharged home to follow-up with the acute care clinic.      ____________________________________________   FINAL CLINICAL IMPRESSION(S) / ED DIAGNOSES  Final diagnoses:   Non-intractable vomiting with nausea, unspecified vomiting type     ED Discharge Orders        Ordered    ondansetron (ZOFRAN ODT) 4 MG disintegrating tablet  Every 8 hours PRN     01/03/17 0457       Note:  This document was prepared using Dragon voice recognition software and may include unintentional dictation errors.    Rebecka ApleyWebster, Erasto Sleight P, MD 01/03/17 [phone]

## 2017-01-03 NOTE — ED Triage Notes (Addendum)
Patient with complaint of vomiting times two that started about an hour ago. Patient denies abdominal pain. Patient with complaint of headache.

## 2017-01-03 NOTE — ED Notes (Signed)
Pt able to tolerate po fluids without difficulty.

## 2017-01-03 NOTE — ED Notes (Signed)
Pt in with co 2 episodes of vomiting tonight while at work. Denies any abd pain, no diarrhea or dysuria. Pt has not tried to drink fluids due to nausea, co headache. States headache started after vomiting rates it 7/10 at this time.

## 2017-07-12 ENCOUNTER — Emergency Department
Admission: EM | Admit: 2017-07-12 | Discharge: 2017-07-12 | Disposition: A | Discharge status: Home / Self Care | Payer: Self-pay | Attending: Emergency Medicine | Admitting: Emergency Medicine

## 2017-07-12 ENCOUNTER — Encounter: Payer: Self-pay | Admitting: Emergency Medicine

## 2017-07-12 DIAGNOSIS — F17210 Nicotine dependence, cigarettes, uncomplicated: Secondary | ICD-10-CM | POA: Insufficient documentation

## 2017-07-12 DIAGNOSIS — J0100 Acute maxillary sinusitis, unspecified: Secondary | ICD-10-CM | POA: Insufficient documentation

## 2017-07-12 DIAGNOSIS — R0981 Nasal congestion: Secondary | ICD-10-CM | POA: Insufficient documentation

## 2017-07-12 DIAGNOSIS — J029 Acute pharyngitis, unspecified: Secondary | ICD-10-CM | POA: Insufficient documentation

## 2017-07-12 LAB — GROUP A STREP BY PCR: GROUP A STREP BY PCR: NOT DETECTED

## 2017-07-12 MED ORDER — AMOXICILLIN 500 MG PO CAPS: 500.0000 mg | ORAL_CAPSULE | Freq: Three times a day (TID) | ORAL | 0 refills | Status: AC

## 2017-07-12 NOTE — ED Provider Notes (Signed)
[facility] Emergency Department Provider Note  ____________________________________________   First MD Initiated Contact with Patient 07/12/17 1000     (approximate)  I have reviewed the triage vital signs and the nursing notes.   HISTORY  Chief Complaint Sore Throat and Nasal Congestion    HPI Andrew Hanna is a 27 y.o. male presents emergency department complaining of sore throat and nasal congestion.  He states he went to a hyper fast which is a car type race and slept outside.  He states his throats been sore since.  Has had a lot of yellow to green nasal congestion.  Denies any fever or chills.  History reviewed. No pertinent past medical history.  There are no active problems to display for this patient.   Past Surgical History:  Procedure Laterality Date  . FINGER AMPUTATION    . KNEE SURGERY Left   . KNEE SURGERY Left     Prior to Admission medications   Medication Sig Start Date End Date Taking? Authorizing Provider  amoxicillin (AMOXIL) 500 MG capsule Take 1 capsule (500 mg total) by mouth 3 (three) times daily. 07/12/17   Faythe Ghee, PA-C    Allergies Patient has no known allergies.  History reviewed. No pertinent family history.  Social History Social History   Tobacco Use  . Smoking status: Current Every Day Smoker    Packs/day: 1.00    Types: Cigarettes  . Smokeless tobacco: Former Engineer, water Use Topics  . Alcohol use: Yes    Comment: occasional  . Drug use: No    Review of Systems  Constitutional: No fever/chills Eyes: No visual changes. ENT: Positive sore throat.  Positive for nasal congestion Respiratory: Denies cough Genitourinary: Negative for dysuria. Musculoskeletal: Negative for back pain. Skin: Negative for rash.    ____________________________________________   PHYSICAL EXAM:  VITAL SIGNS: ED Triage Vitals  Enc Vitals Group     BP 07/12/17 0924 134/90     Pulse Rate 07/12/17 0924  86     Resp 07/12/17 0924 17     Temp 07/12/17 0924 98 F (36.7 C)     Temp Source 07/12/17 0924 Oral     SpO2 07/12/17 0924 100 %     Weight 07/12/17 0923 175 lb (79.4 kg)     Height 07/12/17 0923  (1.905 m)     Head Circumference --      Peak Flow --      Pain Score 07/12/17 0923 6     Pain Loc --      Pain Edu? --      Excl. in GC? --     Constitutional: Alert and oriented. Well appearing and in no acute distress. Eyes: Conjunctivae are normal.  Head: Atraumatic. Nose: No congestion/rhinnorhea. Mouth/Throat: Mucous membranes are moist.  Throat is red and swollen Neck: Is supple, no lymphadenopathy is noted  Cardiovascular: Normal rate, regular rhythm.  Heart sounds are normal Respiratory: Normal respiratory effort.  No retractions, lungs clear to auscultation GU: deferred Musculoskeletal: FROM all extremities, warm and well perfused Neurologic:  Normal speech and language.  Skin:  Skin is warm, dry and intact. No rash noted. Psychiatric: Mood and affect are normal. Speech and behavior are normal.  ____________________________________________   LABS (all labs ordered are listed, but only abnormal results are displayed)  Labs Reviewed  GROUP A STREP BY PCR   ____________________________________________   ____________________________________________  RADIOLOGY    ____________________________________________   PROCEDURES  Procedure(s)  performed: No  Procedures    ____________________________________________   INITIAL IMPRESSION / ASSESSMENT AND PLAN / ED COURSE  Pertinent labs & imaging results that were available during my care of the patient were reviewed by me and considered in my medical decision making (see chart for details).  Patient is a 27 year old male presents emergency department complaining of a sore throat and sinus congestion with green to yellow mucus.  He denies fever or chills.  Physical exam patient appears well.  He is  afebrile.  Throat is bright red and swollen.  He does have some nasal congestion and postnasal drip.  Strep test is negative  Test results were discussed with the patient.  He was given a prescription for amoxicillin 500 mg 3 times a day for 10 days.  He was instructed on how to use the medication he was instructed to use warm salt water gargles and saline nasal rinse.  He states he understands will comply with our instructions.  He was discharged in stable condition.    As part of my medical decision making, I reviewed the following data within the electronic MEDICAL RECORD NUMBER Nursing notes reviewed and incorporated, Labs reviewed strep test is negative, Notes from prior ED visits and [place] Controlled Substance Database  ____________________________________________   FINAL CLINICAL IMPRESSION(S) / ED DIAGNOSES  Final diagnoses:  Acute maxillary sinusitis, recurrence not specified  Acute pharyngitis, unspecified etiology      NEW MEDICATIONS STARTED DURING THIS VISIT:  Discharge Medication List as of 07/12/2017 10:13 AM    START taking these medications   Details  amoxicillin (AMOXIL) 500 MG capsule Take 1 capsule (500 mg total) by mouth 3 (three) times daily., Starting Tue 07/12/2017, Print         Note:  This document was prepared using Dragon voice recognition software and may include unintentional dictation errors.    Faythe Ghee, PA-C 07/12/17 1643    Phineas Semen, MD 07/13/17 [phone]

## 2017-07-12 NOTE — Discharge Instructions (Addendum)
Follow-up with your regular doctor if not better in 3 to 5 days.  Return emergency department if worsening.  Use medication as prescribed.  Gargle with warm salt water this will soothe your throat.  Use over-the-counter saline nasal rinse to clear the nasal passage.  If you use this twice a day and will help clear out the mucus and germs.  May take Tylenol or ibuprofen as needed for pain

## 2017-07-12 NOTE — ED Triage Notes (Signed)
Pt c/o nasal congestion as well as sore throat since Saturday. Pt denies SOB, cough and fever.

## 2018-07-03 ENCOUNTER — Other Ambulatory Visit: Payer: Self-pay

## 2018-07-03 ENCOUNTER — Emergency Department: Payer: BLUE CROSS/BLUE SHIELD

## 2018-07-03 ENCOUNTER — Emergency Department
Admission: EM | Admit: 2018-07-03 | Discharge: 2018-07-03 | Disposition: A | Discharge status: Home / Self Care | Payer: BLUE CROSS/BLUE SHIELD | Attending: Emergency Medicine | Admitting: Emergency Medicine

## 2018-07-03 DIAGNOSIS — F17210 Nicotine dependence, cigarettes, uncomplicated: Secondary | ICD-10-CM | POA: Diagnosis not present

## 2018-07-03 DIAGNOSIS — Z20828 Contact with and (suspected) exposure to other viral communicable diseases: Secondary | ICD-10-CM | POA: Diagnosis not present

## 2018-07-03 DIAGNOSIS — B9789 Other viral agents as the cause of diseases classified elsewhere: Secondary | ICD-10-CM | POA: Diagnosis not present

## 2018-07-03 DIAGNOSIS — R509 Fever, unspecified: Secondary | ICD-10-CM | POA: Diagnosis present

## 2018-07-03 DIAGNOSIS — J988 Other specified respiratory disorders: Secondary | ICD-10-CM | POA: Insufficient documentation

## 2018-07-03 LAB — GROUP A STREP BY PCR: Group A Strep by PCR: NOT DETECTED

## 2018-07-03 MED ORDER — LIDOCAINE VISCOUS HCL 2 % MT SOLN: 15.0000 mL | OROMUCOSAL | 0 refills | Status: AC | PRN

## 2018-07-03 NOTE — ED Provider Notes (Signed)
[facility] Emergency Department Provider Note   ____________________________________________    I have reviewed the triage vital signs and the nursing notes.   HISTORY  Chief Complaint Fever     HPI Andrew Hanna is a 28 y.o. male who presents with complaints of fever chills myalgia, sore throat.  He denies cough or shortness of breath.  He reports his family has had similar symptoms and they have all been tested for strep throat and COVID all of which have been negative.  No recent travel.  No known exposure to COVID positive patients.  No rash or chest pain.  Has not take anything for this  History reviewed. No pertinent past medical history.  There are no active problems to display for this patient.   Past Surgical History:  Procedure Laterality Date  . FINGER AMPUTATION    . KNEE SURGERY Left   . KNEE SURGERY Left     Prior to Admission medications   Medication Sig Start Date End Date Taking? Authorizing Provider  amoxicillin (AMOXIL) 500 MG capsule Take 1 capsule (500 mg total) by mouth 3 (three) times daily. 07/12/17   Fisher, Roselyn BeringSusan W, PA-C  lidocaine (XYLOCAINE) 2 % solution Use as directed 15 mLs in the mouth or throat as needed for mouth pain. 07/03/18   Jene EveryKinner, Montrel Donahoe, MD     Allergies Cantaloupe (diagnostic)  No family history on file.  Social History Social History   Tobacco Use  . Smoking status: Current Every Day Smoker    Packs/day: 1.00    Types: Cigarettes  . Smokeless tobacco: Former Engineer, waterUser  Substance Use Topics  . Alcohol use: Yes    Comment: occasional  . Drug use: No    Review of Systems  Constitutional: As above Eyes: No visual changes.  ENT: Sore throat as above Cardiovascular: Denies chest pain. Respiratory: Denies shortness of breath. Gastrointestinal: No abdominal pain.  No nausea, no vomiting.   Genitourinary: Negative for dysuria. Musculoskeletal: Myalgias as above Skin: Negative for rash.  Neurological: Negative for headaches    ____________________________________________   PHYSICAL EXAM:  VITAL SIGNS: ED Triage Vitals  Enc Vitals Group     BP 07/03/18 1051 (!) 131/92     Pulse Rate 07/03/18 1051 71     Resp 07/03/18 1051 18     Temp 07/03/18 1051 98.6 F (37 C)     Temp Source 07/03/18 1051 Oral     SpO2 07/03/18 1051 98 %     Weight 07/03/18 1032 72.6 kg (160 lb)     Height 07/03/18 1032 1.905 m (6\' 3" )     Head Circumference --      Peak Flow --      Pain Score 07/03/18 1032 2     Pain Loc --      Pain Edu? --      Excl. in GC? --     Constitutional: Alert and oriented.  Eyes: Conjunctivae are normal.   Nose: No congestion/rhinnorhea. Mouth/Throat: Mucous membranes are moist.  Tonsils are enlarged, exudate noted on his left tonsil Neck:  Painless ROM Cardiovascular: Normal rate, regular rhythm. Grossly normal heart sounds.  Good peripheral circulation. Respiratory: Normal respiratory effort.  No retractions. Lungs CTAB. Gastrointestinal: Soft and nontender. No distention.    Musculoskeletal:  Warm and well perfused Neurologic:  Normal speech and language. No gross focal neurologic deficits are appreciated.  Skin:  Skin is warm, dry and intact. No rash noted. Psychiatric: Mood and  affect are normal. Speech and behavior are normal.  ____________________________________________   LABS (all labs ordered are listed, but only abnormal results are displayed)  Labs Reviewed  NOVEL CORONAVIRUS, NAA (HOSPITAL ORDER, SEND-OUT TO REF LAB)  GROUP A STREP BY PCR   ____________________________________________  EKG  None ____________________________________________  RADIOLOGY  Chest x-ray ____________________________________________   PROCEDURES  Procedure(s) performed: No  Procedures   Critical Care performed: No ____________________________________________   INITIAL IMPRESSION / ASSESSMENT AND PLAN / ED COURSE  Pertinent labs &  imaging results that were available during my care of the patient were reviewed by me and considered in my medical decision making (see chart for details).  Patient presents with viral symptoms concerning for COVID-19 given the pandemic.  He does have erythematous tonsils with some exudates as well, will send strep, check chest x-ray, send send out coronavirus swab.  Vitals are reassuring.  Patient does not want to wait for his strep swab, we will follow-up on the result.  Indicated the patient that he needs to quarantine until coronavirus test has resulted   ____________________________________________   FINAL CLINICAL IMPRESSION(S) / ED DIAGNOSES  Final diagnoses:  Viral respiratory illness        Note:  This document was prepared using Dragon voice recognition software and may include unintentional dictation errors.   Jene Every, MD 07/03/18 1255

## 2018-07-03 NOTE — ED Triage Notes (Signed)
Pt c/o fever, sore throat, bodyaches since Saturday. States his children had similar sx and were negative for flu, strep and covid.

## 2018-07-03 NOTE — ED Notes (Signed)
Pt states that he feels feverish at night, has gotten up to 103 per home thermometer in ear. States sore throat, yellowish thick drainage at back of throat, unable to eat for 3 days, but is able to drink. Drinking gatorade and pepsi. No SOB or cough.

## 2018-07-04 LAB — NOVEL CORONAVIRUS, NAA (HOSP ORDER, SEND-OUT TO REF LAB; TAT 18-24 HRS): SARS-CoV-2, NAA: NOT DETECTED

## 2018-07-05 ENCOUNTER — Telehealth: Payer: Self-pay | Admitting: Emergency Medicine

## 2018-07-05 NOTE — Telephone Encounter (Signed)
Pt calling spoke to secretary wanting results of COVID, and requested to speak to MD.    Andrew Hanna pt back he had already received results over a day ago. And wants to feel better.  This Clinical research associate asked if his symptoms are not improving to follow up in the ED, PCP , or urgent care. Pt hung up

## 2019-02-16 ENCOUNTER — Other Ambulatory Visit: Payer: Self-pay

## 2019-02-16 DIAGNOSIS — Z79899 Other long term (current) drug therapy: Secondary | ICD-10-CM | POA: Diagnosis not present

## 2019-02-16 DIAGNOSIS — R197 Diarrhea, unspecified: Secondary | ICD-10-CM | POA: Diagnosis not present

## 2019-02-16 DIAGNOSIS — Z20828 Contact with and (suspected) exposure to other viral communicable diseases: Secondary | ICD-10-CM | POA: Insufficient documentation

## 2019-02-16 DIAGNOSIS — R112 Nausea with vomiting, unspecified: Secondary | ICD-10-CM | POA: Diagnosis not present

## 2019-02-16 DIAGNOSIS — F17210 Nicotine dependence, cigarettes, uncomplicated: Secondary | ICD-10-CM | POA: Diagnosis not present

## 2019-02-16 DIAGNOSIS — R1013 Epigastric pain: Secondary | ICD-10-CM | POA: Diagnosis not present

## 2019-02-16 LAB — COMPREHENSIVE METABOLIC PANEL
ALT: 35 U/L (ref 0–44)
AST: 26 U/L (ref 15–41)
Albumin: 5.1 g/dL — ABNORMAL HIGH (ref 3.5–5.0)
Alkaline Phosphatase: 63 U/L (ref 38–126)
Anion gap: 9 (ref 5–15)
BUN: 21 mg/dL — ABNORMAL HIGH (ref 6–20)
CO2: 25 mmol/L (ref 22–32)
Calcium: 9.7 mg/dL (ref 8.9–10.3)
Chloride: 102 mmol/L (ref 98–111)
Creatinine, Ser: 0.91 mg/dL (ref 0.61–1.24)
GFR calc Af Amer: >60 mL/min (ref >60)
GFR calc non Af Amer: >60 mL/min (ref >60)
Glucose, Bld: 120 mg/dL — ABNORMAL HIGH (ref 70–99)
Potassium: 4.5 mmol/L (ref 3.5–5.1)
Sodium: 136 mmol/L (ref 135–145)
Total Bilirubin: 2.3 mg/dL — ABNORMAL HIGH (ref 0.3–1.2)
Total Protein: 8.6 g/dL — ABNORMAL HIGH (ref 6.5–8.1)

## 2019-02-16 LAB — URINALYSIS, COMPLETE (UACMP) WITH MICROSCOPIC
Bacteria, UA: NONE SEEN
Bilirubin Urine: NEGATIVE
Glucose, UA: NEGATIVE mg/dL
Ketones, ur: NEGATIVE mg/dL
Leukocytes,Ua: NEGATIVE
Nitrite: NEGATIVE
Protein, ur: NEGATIVE mg/dL
Specific Gravity, Urine: 1.032 — ABNORMAL HIGH (ref 1.005–1.030)
pH: 5 (ref 5.0–8.0)

## 2019-02-16 LAB — CBC
HCT: 51.1 % (ref 39.0–52.0)
Hemoglobin: 17.6 g/dL — ABNORMAL HIGH (ref 13.0–17.0)
MCH: 30.7 pg (ref 26.0–34.0)
MCHC: 34.4 g/dL (ref 30.0–36.0)
MCV: 89 fL (ref 80.0–100.0)
Platelets: 239 10*3/uL (ref 150–400)
RBC: 5.74 MIL/uL (ref 4.22–5.81)
RDW: 12.4 % (ref 11.5–15.5)
WBC: 15.6 10*3/uL — ABNORMAL HIGH (ref 4.0–10.5)
nRBC: 0 % (ref 0.0–0.2)

## 2019-02-16 LAB — LIPASE, BLOOD: Lipase: 25 U/L (ref 11–51)

## 2019-02-16 NOTE — ED Triage Notes (Signed)
Pt arrives to ED via POV from home with c/o N/V/D x1 day. Pt reports "30" episodes of vomiting and "3" episodes of diarrhea over the last 24 hrs. No fever; no c/o CP or SHOB. No recent sick contacts or known COVID exposure. Pt is A&O, in NAD; RR even, regular, and unlabored.

## 2019-02-16 NOTE — ED Notes (Signed)
Pt with episode of emesis in lobby; pt offered ODT Zofran, declines at this time.

## 2019-02-17 ENCOUNTER — Emergency Department
Admission: EM | Admit: 2019-02-17 | Discharge: 2019-02-17 | Disposition: A | Discharge status: Home / Self Care | Payer: BC Managed Care – PPO | Attending: Student in an Organized Health Care Education/Training Program | Admitting: Student in an Organized Health Care Education/Training Program

## 2019-02-17 ENCOUNTER — Emergency Department: Payer: BC Managed Care – PPO

## 2019-02-17 DIAGNOSIS — R112 Nausea with vomiting, unspecified: Secondary | ICD-10-CM

## 2019-02-17 DIAGNOSIS — R1013 Epigastric pain: Secondary | ICD-10-CM

## 2019-02-17 LAB — URINE DRUG SCREEN, QUALITATIVE (ARMC ONLY)
Amphetamines, Ur Screen: NOT DETECTED
Barbiturates, Ur Screen: NOT DETECTED
Benzodiazepine, Ur Scrn: NOT DETECTED
Cannabinoid 50 Ng, Ur ~~LOC~~: POSITIVE — AB
Cocaine Metabolite,Ur ~~LOC~~: NOT DETECTED
MDMA (Ecstasy)Ur Screen: NOT DETECTED
Methadone Scn, Ur: NOT DETECTED
Opiate, Ur Screen: NOT DETECTED
Phencyclidine (PCP) Ur S: NOT DETECTED
Tricyclic, Ur Screen: NOT DETECTED

## 2019-02-17 LAB — POC SARS CORONAVIRUS 2 AG: SARS Coronavirus 2 Ag: NEGATIVE

## 2019-02-17 MED ORDER — METOCLOPRAMIDE HCL 10 MG PO TABS: 10.0000 mg | ORAL_TABLET | Freq: Four times a day (QID) | ORAL | 0 refills | Status: AC | PRN

## 2019-02-17 MED ORDER — SODIUM CHLORIDE 0.9 % IV BOLUS
1000.0000 mL | Freq: Once | INTRAVENOUS | Status: AC
  Administered 2019-02-17: 1000 mL via INTRAVENOUS

## 2019-02-17 MED ORDER — PROMETHAZINE HCL 25 MG/ML IJ SOLN
12.5000 mg | Freq: Four times a day (QID) | INTRAMUSCULAR | Status: DC | PRN
  Administered 2019-02-17: 12.5 mg via INTRAVENOUS
  Filled 2019-02-17: qty 1

## 2019-02-17 MED ORDER — PROMETHAZINE HCL 25 MG PO TABS
12.5000 mg | ORAL_TABLET | Freq: Once | ORAL | Status: AC
  Administered 2019-02-17: 12.5 mg via ORAL
  Filled 2019-02-17: qty 1

## 2019-02-17 NOTE — ED Provider Notes (Signed)
[facility] Emergency Department Provider Note    First MD Initiated Contact with Patient 02/17/19 0020     (approximate)  I have reviewed the triage vital signs and the nursing notes.   HISTORY  Chief Complaint Emesis and Diarrhea    HPI BRADON FESTER is a 28 y.o. male the below listed past medical history presents the ER for evaluation of 12 hours of nausea vomiting and diarrhea.  States he did stay up late last night wrapping presents and then around noon today started feeling crampy abdominal discomfort.  Has had multiple episodes of nonbloody nonbilious emesis.  States he is having some abdominal cramping secondary to the frequent vomiting.  Feels like he is dehydrated.  Denies any measured fevers.    History reviewed. No pertinent past medical history. No family history on file. Past Surgical History:  Procedure Laterality Date  . FINGER AMPUTATION    . KNEE SURGERY Left   . KNEE SURGERY Left    There are no problems to display for this patient.     Prior to Admission medications   Medication Sig Start Date End Date Taking? Authorizing Provider  amoxicillin (AMOXIL) 500 MG capsule Take 1 capsule (500 mg total) by mouth 3 (three) times daily. 07/12/17   Fisher, Linden Dolin, PA-C  lidocaine (XYLOCAINE) 2 % solution Use as directed 15 mLs in the mouth or throat as needed for mouth pain. 07/03/18   Lavonia Drafts, MD  metoCLOPramide (REGLAN) 10 MG tablet Take 1 tablet (10 mg total) by mouth every 6 (six) hours as needed. 02/17/19 02/17/20  Merlyn Lot, MD    Allergies Cantaloupe (diagnostic)    Social History Social History   Tobacco Use  . Smoking status: Current Every Day Smoker    Packs/day: 1.00    Types: Cigarettes  . Smokeless tobacco: Former Network engineer Use Topics  . Alcohol use: Yes    Comment: occasional  . Drug use: No    Review of Systems Patient denies headaches, rhinorrhea, blurry vision, numbness, shortness of  breath, chest pain, edema, cough, abdominal pain, nausea, vomiting, diarrhea, dysuria, fevers, rashes or hallucinations unless otherwise stated above in HPI. ____________________________________________   PHYSICAL EXAM:  VITAL SIGNS: Vitals:   02/17/19 0006 02/17/19 0221  BP: (!) 144/85 128/71  Pulse: 76 80  Resp: 17 17  Temp: 98.1 F (36.7 C)   SpO2: 97% 96%    Constitutional: Alert and oriented.  Eyes: Conjunctivae are normal.  Head: Atraumatic. Nose: No congestion/rhinnorhea. Mouth/Throat: Mucous membranes are moist.   Neck: No stridor. Painless ROM.  Cardiovascular: Normal rate, regular rhythm. Grossly normal heart sounds.  Good peripheral circulation. Respiratory: Normal respiratory effort.  No retractions. Lungs CTAB. Gastrointestinal: Soft and nontender. No distention. No abdominal bruits. No CVA tenderness. Genitourinary:  Musculoskeletal: No lower extremity tenderness nor edema.  No joint effusions. Neurologic:  Normal speech and language. No gross focal neurologic deficits are appreciated. No facial droop Skin:  Skin is warm, dry and intact. No rash noted. Psychiatric: Mood and affect are normal. Speech and behavior are normal.  ____________________________________________   LABS (all labs ordered are listed, but only abnormal results are displayed)  Results for orders placed or performed during the hospital encounter of 02/17/19 (from the past 24 hour(s))  Lipase, blood     Status: None   Collection Time: 02/16/19  8:46 PM  Result Value Ref Range   Lipase 25 11 - 51 U/L  Comprehensive metabolic panel  Status: Abnormal   Collection Time: 02/16/19  8:46 PM  Result Value Ref Range   Sodium 136 135 - 145 mmol/L   Potassium 4.5 3.5 - 5.1 mmol/L   Chloride 102 98 - 111 mmol/L   CO2 25 22 - 32 mmol/L   Glucose, Bld 120 (H) 70 - 99 mg/dL   BUN 21 (H) 6 - 20 mg/dL   Creatinine, Ser 4.090.91 0.61 - 1.24 mg/dL   Calcium 9.7 8.9 - 81.110.3 mg/dL   Total Protein 8.6 (H)  6.5 - 8.1 g/dL   Albumin 5.1 (H) 3.5 - 5.0 g/dL   AST 26 15 - 41 U/L   ALT 35 0 - 44 U/L   Alkaline Phosphatase 63 38 - 126 U/L   Total Bilirubin 2.3 (H) 0.3 - 1.2 mg/dL   GFR calc non Af Amer >60 >60 mL/min   GFR calc Af Amer >60 >60 mL/min   Anion gap 9 5 - 15  CBC     Status: Abnormal   Collection Time: 02/16/19  8:46 PM  Result Value Ref Range   WBC 15.6 (H) 4.0 - 10.5 K/uL   RBC 5.74 4.22 - 5.81 MIL/uL   Hemoglobin 17.6 (H) 13.0 - 17.0 g/dL   HCT 91.451.1 78.239.0 - 95.652.0 %   MCV 89.0 80.0 - 100.0 fL   MCH 30.7 26.0 - 34.0 pg   MCHC 34.4 30.0 - 36.0 g/dL   RDW 21.312.4 08.611.5 - 57.815.5 %   Platelets 239 150 - 400 K/uL   nRBC 0.0 0.0 - 0.2 %  Urinalysis, Complete w Microscopic     Status: Abnormal   Collection Time: 02/16/19 10:24 PM  Result Value Ref Range   Color, Urine YELLOW (A) YELLOW   APPearance HAZY (A) CLEAR   Specific Gravity, Urine 1.032 (H) 1.005 - 1.030   pH 5.0 5.0 - 8.0   Glucose, UA NEGATIVE NEGATIVE mg/dL   Hgb urine dipstick SMALL (A) NEGATIVE   Bilirubin Urine NEGATIVE NEGATIVE   Ketones, ur NEGATIVE NEGATIVE mg/dL   Protein, ur NEGATIVE NEGATIVE mg/dL   Nitrite NEGATIVE NEGATIVE   Leukocytes,Ua NEGATIVE NEGATIVE   RBC / HPF 0-5 0 - 5 RBC/hpf   WBC, UA 0-5 0 - 5 WBC/hpf   Bacteria, UA NONE SEEN NONE SEEN   Squamous Epithelial / LPF 0-5 0 - 5   Mucus PRESENT   Urine Drug Screen, Qualitative ([facility] only)     Status: Abnormal   Collection Time: 02/16/19 10:24 PM  Result Value Ref Range   Tricyclic, Ur Screen NONE DETECTED NONE DETECTED   Amphetamines, Ur Screen NONE DETECTED NONE DETECTED   MDMA (Ecstasy)Ur Screen NONE DETECTED NONE DETECTED   Cocaine Metabolite,Ur [place] NONE DETECTED NONE DETECTED   Opiate, Ur Screen NONE DETECTED NONE DETECTED   Phencyclidine (PCP) Ur S NONE DETECTED NONE DETECTED   Cannabinoid 50 Ng, Ur [place] POSITIVE (A) NONE DETECTED   Barbiturates, Ur Screen NONE DETECTED NONE DETECTED   Benzodiazepine, Ur Scrn NONE DETECTED NONE DETECTED    Methadone Scn, Ur NONE DETECTED NONE DETECTED  POC SARS Coronavirus 2 Ag     Status: None   Collection Time: 02/17/19  2:09 AM  Result Value Ref Range   SARS Coronavirus 2 Ag NEGATIVE NEGATIVE   ____________________________________________  ____________________________________________  RADIOLOGY  I personally reviewed all radiographic images ordered to evaluate for the above acute complaints and reviewed radiology reports and findings.  These findings were personally discussed with the patient.  Please see  medical record for radiology report.  ____________________________________________   PROCEDURES  Procedure(s) performed:  Procedures    Critical Care performed: no ____________________________________________   INITIAL IMPRESSION / ASSESSMENT AND PLAN / ED COURSE  Pertinent labs & imaging results that were available during my care of the patient were reviewed by me and considered in my medical decision making (see chart for details).   DDX: Dehydration, electrolyte abnormality, enteritis, SBO, Boerhaave's, appendicitis, diverticulitis, Covid, C. Difficile, hyperemesis cannabinoid syndrome   JAYVEN NAILL is a 28 y.o. who presents to the ED with nausea vomiting as described above.  Patient nontoxic-appearing but does appear dehydrated.  Will give IV fluids as well as IV antiemetics.  Blood work does show some dehydration.  His abdominal exam is otherwise benign.  Will order imaging for the but differential.  Clinical Course as of Feb 16 234  Sat Feb 17, 2019  0230 Imaging reassuring.  Patient feels significantly improved after IV fluids and antiemetic.  He is tolerating p.o.  Repeat abdominal exam is soft and benign.  At this point do believe he stable and appropriate for outpatient follow-up.  Do not feel that further diagnostic imaging clinically indicated with this presentation.  Discussed recommended obtaining stool samples with patient has not had any diarrhea since  being in the ER.  Have a lower suspicion for C. difficile or infectious colitis given the lack of bloody stool seems like primary symptoms were vomiting it is no longer intractable.  Have discussed with the patient and available family all diagnostics and treatments performed thus far and all questions were answered to the best of my ability. The patient demonstrates understanding and agreement with plan.    [PR]    Clinical Course User Index [PR] Willy Eddy, MD    The patient was evaluated in Emergency Department today for the symptoms described in the history of present illness. He/she was evaluated in the context of the global COVID-19 pandemic, which necessitated consideration that the patient might be at risk for infection with the SARS-CoV-2 virus that causes COVID-19. Institutional protocols and algorithms that pertain to the evaluation of patients at risk for COVID-19 are in a state of rapid change based on information released by regulatory bodies including the CDC and federal and state organizations. These policies and algorithms were followed during the patient's care in the ED.  As part of my medical decision making, I reviewed the following data within the electronic MEDICAL RECORD NUMBER Nursing notes reviewed and incorporated, Labs reviewed, notes from prior ED visits and [place] Controlled Substance Database   ____________________________________________   FINAL CLINICAL IMPRESSION(S) / ED DIAGNOSES  Final diagnoses:  Acute epigastric pain  Nausea vomiting and diarrhea      NEW MEDICATIONS STARTED DURING THIS VISIT:  New Prescriptions   METOCLOPRAMIDE (REGLAN) 10 MG TABLET    Take 1 tablet (10 mg total) by mouth every 6 (six) hours as needed.     Note:  This document was prepared using Dragon voice recognition software and may include unintentional dictation errors.    Willy Eddy, MD 02/17/19 [phone]

## 2019-02-17 NOTE — ED Notes (Signed)
Pt given (2) 4oz cups of ice cream at this time (1 vanilla and 1 chocolate per pt's request) to assess pt's ability to tolerate PO intake.

## 2020-11-29 IMAGING — US US ABDOMEN LIMITED
1 series · 14 of 25 positions shown · non-contrast
Comparison: None.

CLINICAL DATA: Nausea, vomiting and diarrhea for 1 day

EXAM:
ULTRASOUND ABDOMEN LIMITED RIGHT UPPER QUADRANT

[Series 1: us abdomen limited · 14 of 62 slices shown]
[im 1/62]
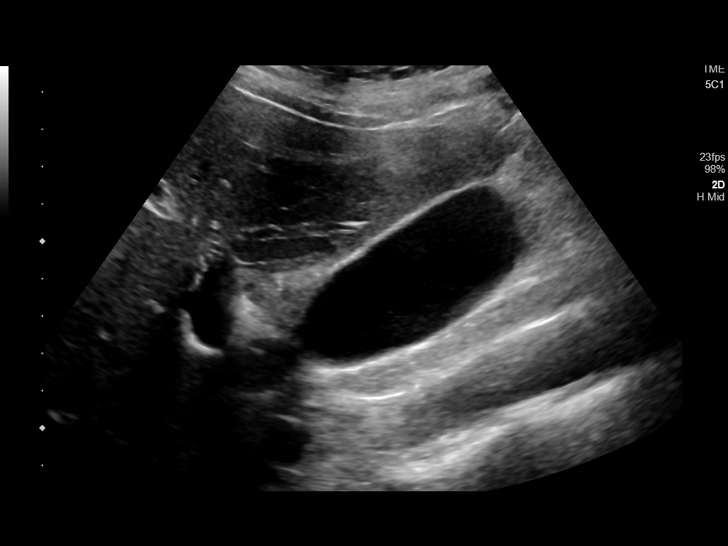
[im 6/62]
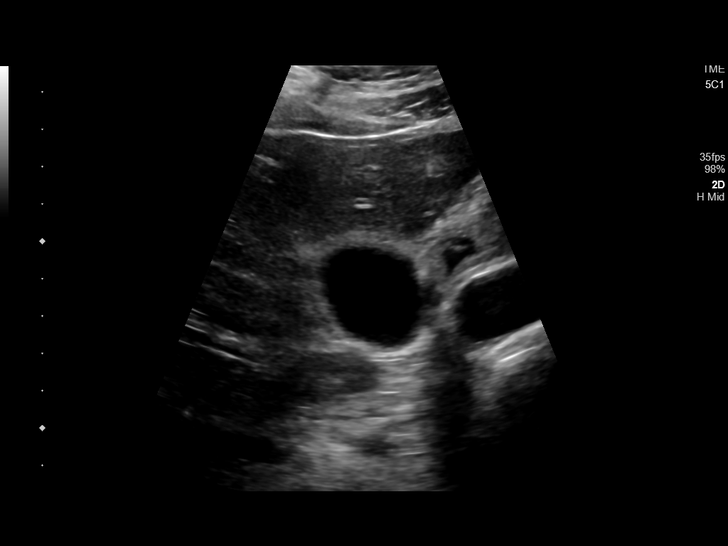
[im 11/62]
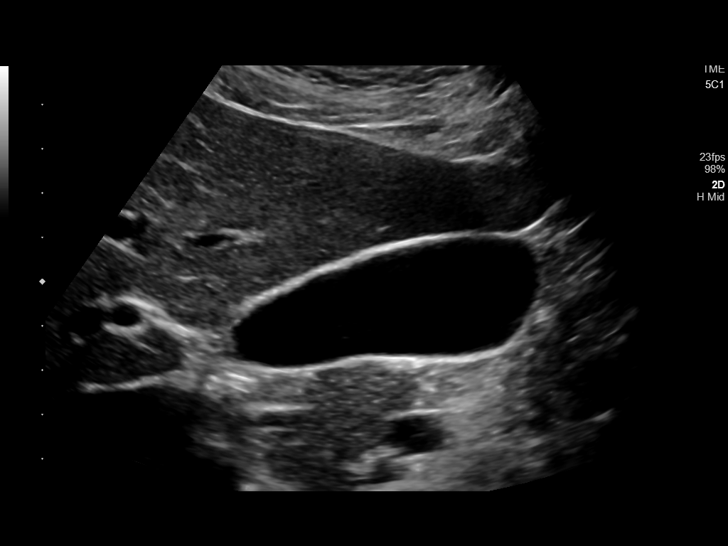
[im 16/62]
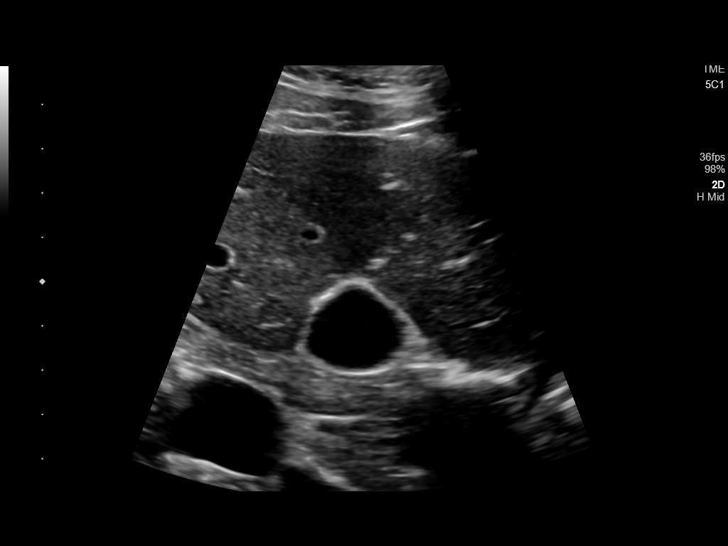
[im 21/62]
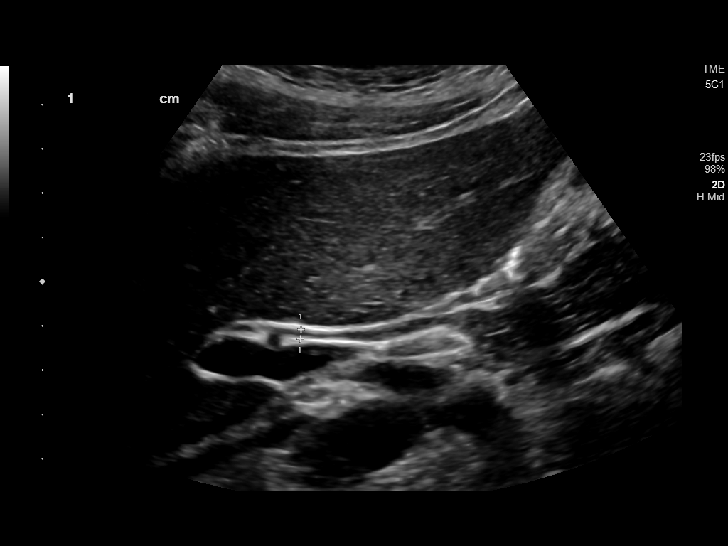
[im 23/62]
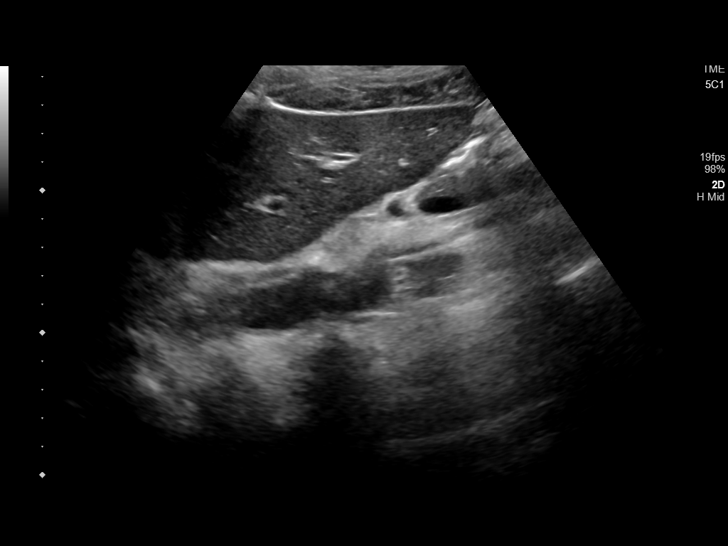
[im 28/62]
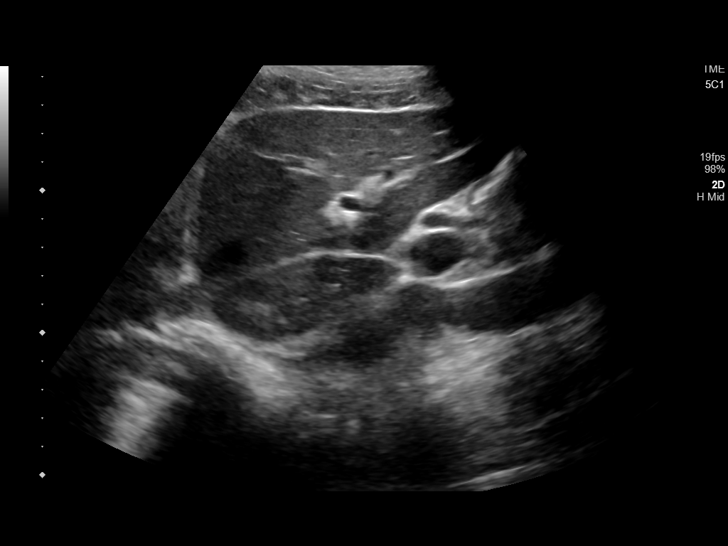
[im 34/62]
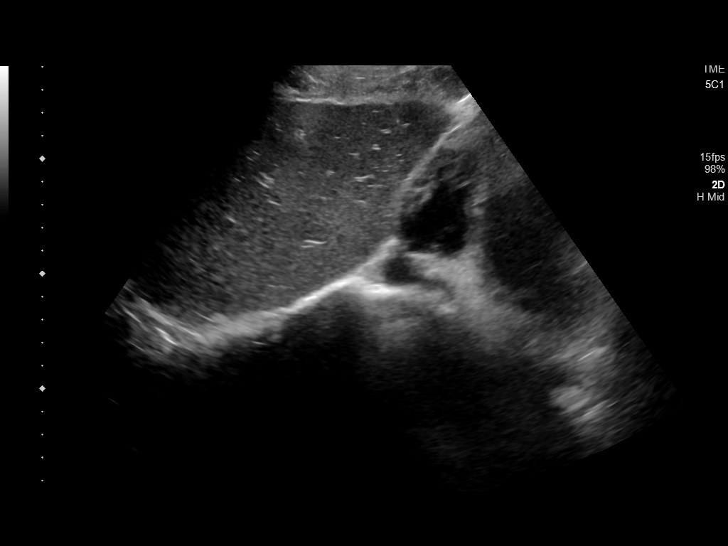
[im 39/62]
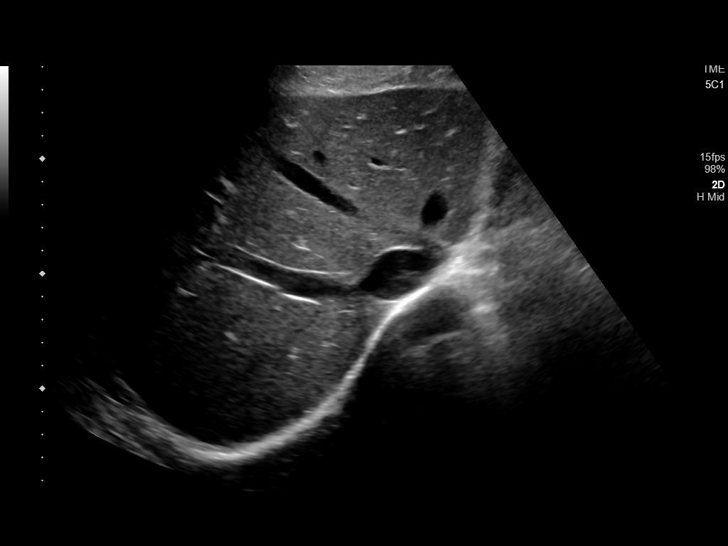
[im 41/62]
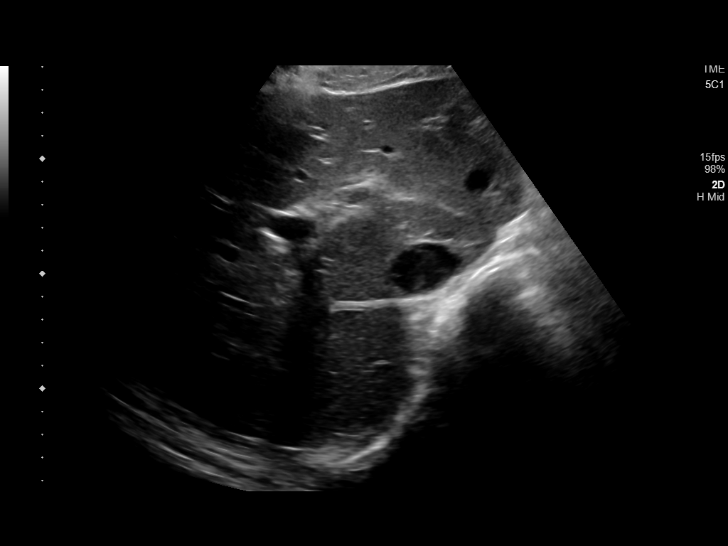
[im 46/62]
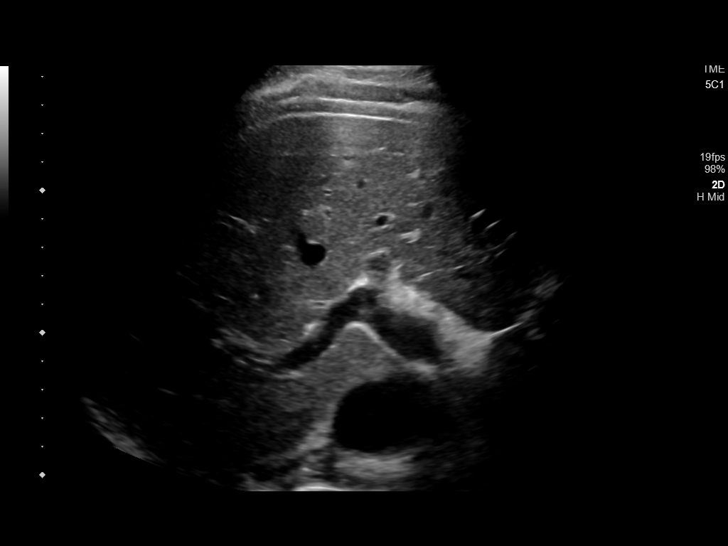
[im 51/62]
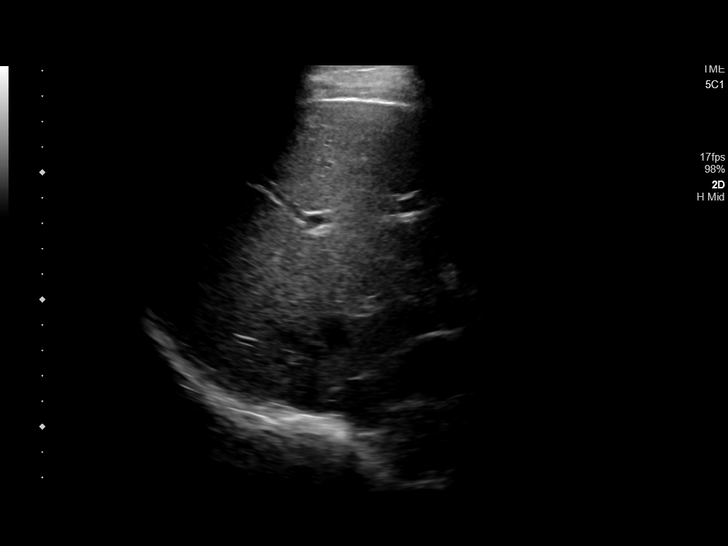
[im 56/62]
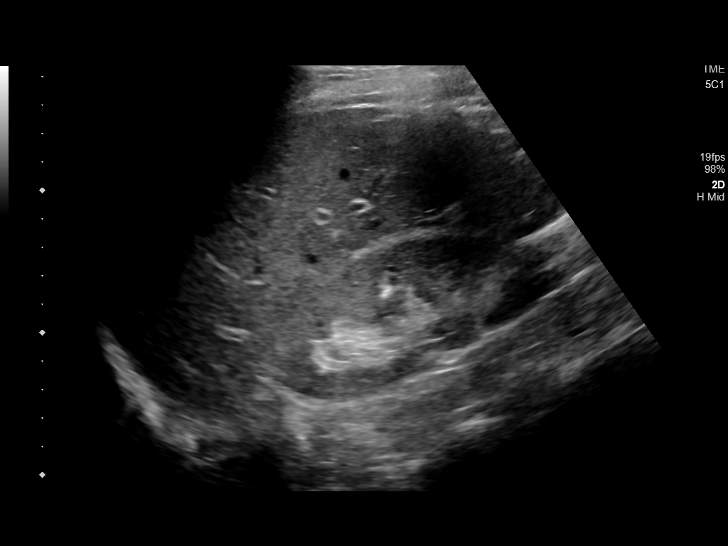
[im 62/62]
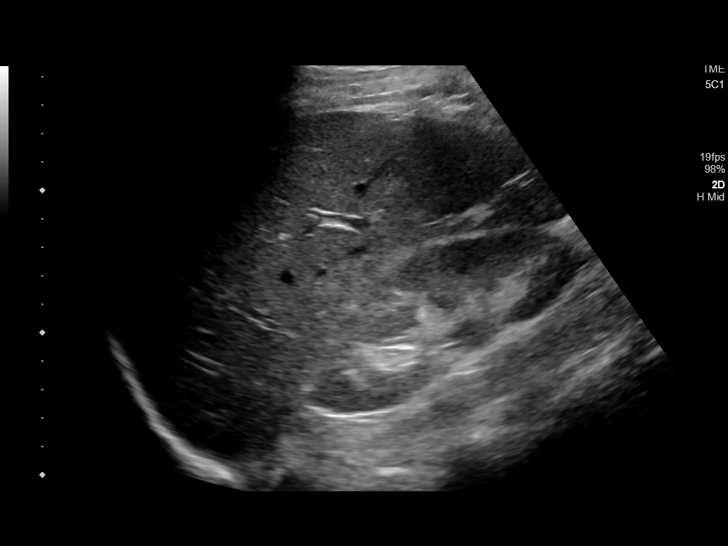

[14 of 25 positions shown; findings below may reference images not displayed]

FINDINGS: Gallbladder:

No gallstones or wall thickening visualized. No sonographic Murphy
sign noted by sonographer.

Common bile duct:

Diameter: 2.1 mm, nondilated

Liver:

No focal lesion identified. Within normal limits in parenchymal
echogenicity. Portal vein is patent on color Doppler imaging with
normal direction of blood flow towards the liver.

Other: None.
IMPRESSION: Unremarkable right upper quadrant ultrasound.

## 2023-04-03 ENCOUNTER — Other Ambulatory Visit: Payer: Self-pay

## 2023-04-03 ENCOUNTER — Emergency Department
Admission: EM | Admit: 2023-04-03 | Discharge: 2023-04-03 | Disposition: A | Discharge status: Home / Self Care | Payer: Self-pay | Attending: Emergency Medicine | Admitting: Emergency Medicine

## 2023-04-03 DIAGNOSIS — Z20822 Contact with and (suspected) exposure to covid-19: Secondary | ICD-10-CM | POA: Insufficient documentation

## 2023-04-03 DIAGNOSIS — J101 Influenza due to other identified influenza virus with other respiratory manifestations: Secondary | ICD-10-CM | POA: Insufficient documentation

## 2023-04-03 LAB — RESP PANEL BY RT-PCR (RSV, FLU A&B, COVID)  RVPGX2
Influenza A by PCR: POSITIVE — AB
Influenza B by PCR: NEGATIVE
Resp Syncytial Virus by PCR: NEGATIVE
SARS Coronavirus 2 by RT PCR: NEGATIVE

## 2023-04-03 MED ORDER — ONDANSETRON HCL 4 MG PO TABS: 4.0000 mg | ORAL_TABLET | Freq: Three times a day (TID) | ORAL | 0 refills | Status: AC | PRN

## 2023-04-03 MED ORDER — KETOROLAC TROMETHAMINE 60 MG/2ML IM SOLN
30.0000 mg | Freq: Once | INTRAMUSCULAR | Status: AC
  Administered 2023-04-03: 30 mg via INTRAMUSCULAR
  Filled 2023-04-03: qty 2

## 2023-04-03 MED ORDER — PROCHLORPERAZINE MALEATE 10 MG PO TABS
10.0000 mg | ORAL_TABLET | Freq: Once | ORAL | Status: AC
  Administered 2023-04-03: 10 mg via ORAL
  Filled 2023-04-03: qty 1

## 2023-04-03 NOTE — Discharge Instructions (Addendum)
 You tested positive for the flu today.  You can continue taking ibuprofen  and Tylenol  as needed.  I have also sent nausea medication to your pharmacy for you to as needed.  Most important key is hydration over the next several days until symptoms fully resolved.

## 2023-04-03 NOTE — ED Provider Notes (Signed)
 [facility] Provider Note    Event Date/Time   First MD Initiated Contact with Patient 04/03/23 1738     (approximate)   History   Fever   HPI Andrew Hanna is a 33 y.o. male presenting today for fever and congestion.  Patient states over the past 4 days he has had fever, dry cough, congestion, headache, and bodyaches.  Child at home is sick as well with similar symptoms.  Wife also has similar symptoms.  No difficulty breathing, abdominal pain.  Intermittent nausea.  1 episode of diarrhea.     Physical Exam   Triage Vital Signs: ED Triage Vitals  Encounter Vitals Group     BP 04/03/23 1639 (!) 150/100     Systolic BP Percentile --      Diastolic BP Percentile --      Pulse Rate 04/03/23 1639 99     Resp 04/03/23 1639 20     Temp 04/03/23 1639 98.6 F (37 C)     Temp Source 04/03/23 1639 Oral     SpO2 04/03/23 1639 94 %     Weight 04/03/23 1640 185 lb (83.9 kg)     Height 04/03/23 1640 6' 3 (1.905 m)     Head Circumference --      Peak Flow --      Pain Score 04/03/23 1646 8     Pain Loc --      Pain Education --      Exclude from Growth Chart --     Most recent vital signs: Vitals:   04/03/23 1639  BP: (!) 150/100  Pulse: 99  Resp: 20  Temp: 98.6 F (37 C)  SpO2: 94%   I have reviewed the vital signs. General:  Awake, alert, no acute distress. Head:  Normocephalic, Atraumatic. EENT:  PERRL, EOMI, Oral mucosa pink and moist, Neck is supple. Cardiovascular: Regular rate, 2+ distal pulses. Respiratory:  Normal respiratory effort, symmetrical expansion, no distress.   Extremities:  Moving all four extremities through full ROM without pain.   Neuro:  Alert and oriented.  Interacting appropriately.   Skin:  Warm, dry, no rash.   Psych: Appropriate affect.    ED Results / Procedures / Treatments   Labs (all labs ordered are listed, but only abnormal results are displayed) Labs Reviewed  RESP PANEL BY RT-PCR (RSV, FLU A&B,  COVID)  RVPGX2 - Abnormal; Notable for the following components:      Result Value   Influenza A by PCR POSITIVE (*)    All other components within normal limits     EKG    RADIOLOGY    PROCEDURES:  Critical Care performed: No  Procedures   MEDICATIONS ORDERED IN ED: Medications  ketorolac  (TORADOL ) injection 30 mg (has no administration in time range)  prochlorperazine  (COMPAZINE ) tablet 10 mg (has no administration in time range)     IMPRESSION / MDM / ASSESSMENT AND PLAN / ED COURSE  I reviewed the triage vital signs and the nursing notes.                              Differential diagnosis includes, but is not limited to, COVID, flu, RSV  Patient's presentation is most consistent with acute complicated illness / injury requiring diagnostic workup.  Patient is a 33 year old male presenting today for cough, congestion, fever, headache, and bodyaches.  Vital signs are stable and physical exam unremarkable.  Patient tested positive for influenza A which is likely source of all his symptoms here today.  Is tolerating p.o. and appears hydrated on exam.  Will give him Toradol  and Compazine  here in the ED and discharged home with Zofran  as needed.  Patient was agreeable with plan and given strict return precautions.  Clinical Course as of 04/03/23 1753  [place] Apr 03, 2023  1739 Influenza A By PCR(!): POSITIVE [DW]    Clinical Course User Index [DW] Andrew Alm DASEN, MD     FINAL CLINICAL IMPRESSION(S) / ED DIAGNOSES   Final diagnoses:  Influenza A     Rx / DC Orders   ED Discharge Orders          Ordered    ondansetron  (ZOFRAN ) 4 MG tablet  Every 8 hours PRN        04/03/23 1751             Note:  This document was prepared using Dragon voice recognition software and may include unintentional dictation errors.   Andrew Alm DASEN, MD 04/03/23 [phone]

## 2023-04-03 NOTE — ED Triage Notes (Signed)
 Pt comes with c/o flu like symptoms for about 4 days. Pt reports kid at home sick.

## 2023-05-30 ENCOUNTER — Other Ambulatory Visit: Payer: Self-pay

## 2023-05-30 ENCOUNTER — Ambulatory Visit
Admission: EM | Admit: 2023-05-30 | Discharge: 2023-05-30 | Disposition: A | Discharge status: Home / Self Care | Payer: Self-pay

## 2023-05-30 ENCOUNTER — Emergency Department
Admission: EM | Admit: 2023-05-30 | Discharge: 2023-05-30 | Discharge status: Against Medical Advice | Payer: Self-pay | Source: Home / Self Care

## 2023-05-30 ENCOUNTER — Encounter: Payer: Self-pay | Admitting: Emergency Medicine

## 2023-05-30 DIAGNOSIS — J302 Other seasonal allergic rhinitis: Secondary | ICD-10-CM

## 2023-05-30 NOTE — ED Provider Notes (Signed)
 Andrew Hanna    CSN: [id number] Arrival date & time: 05/30/23  1050      History   Chief Complaint Chief Complaint  Patient presents with   sneezing    HPI Andrew Hanna is a 33 y.o. male.   Patient presents for evaluation of sneezing and congestion beginning 1 day ago, interfering with sleep.  Believes to be seasonal allergies, saw improvement used use of antihistamine.  Denies fever, ear pain, sore throat, cough.  Eating food and liquids.  History reviewed. No pertinent past medical history.  There are no active problems to display for this patient.   Past Surgical History:  Procedure Laterality Date   FINGER AMPUTATION     KNEE SURGERY Left    KNEE SURGERY Left        Home Medications    Prior to Admission medications   Medication Sig Start Date End Date Taking? Authorizing Provider  amoxicillin (AMOXIL) 500 MG capsule Take 1 capsule (500 mg total) by mouth 3 (three) times daily. 07/12/17   Fisher, Roselyn Bering, PA-C  lidocaine (XYLOCAINE) 2 % solution Use as directed 15 mLs in the mouth or throat as needed for mouth pain. 07/03/18   Jene Every, MD  metoCLOPramide (REGLAN) 10 MG tablet Take 1 tablet (10 mg total) by mouth every 6 (six) hours as needed. 02/17/19 02/17/20  Willy Eddy, MD  ondansetron (ZOFRAN) 4 MG tablet Take 1 tablet (4 mg total) by mouth every 8 (eight) hours as needed for vomiting or nausea. 04/03/23 04/02/24  Janith Lima, MD    Family History History reviewed. No pertinent family history.  Social History Social History   Tobacco Use   Smoking status: Every Day    Current packs/day: 1.00    Types: Cigarettes   Smokeless tobacco: Former  Substance Use Topics   Alcohol use: Yes    Comment: occasional   Drug use: No     Allergies   Cantaloupe (diagnostic)   Review of Systems Review of Systems   Physical Exam Triage Vital Signs ED Triage Vitals [05/30/23 1104]  Encounter Vitals Group     BP 125/69     Systolic  BP Percentile      Diastolic BP Percentile      Pulse Rate (!) 57     Resp 16     Temp 97.6 F (36.4 C)     Temp Source Temporal     SpO2 96 %     Weight      Height      Head Circumference      Peak Flow      Pain Score 0     Pain Loc      Pain Education      Exclude from Growth Chart    No data found.  Updated Vital Signs BP 125/69 (BP Location: Left Arm)   Pulse (!) 57   Temp 97.6 F (36.4 C) (Temporal)   Resp 16   SpO2 96%   Visual Acuity Right Eye Distance:   Left Eye Distance:   Bilateral Distance:    Right Eye Near:   Left Eye Near:    Bilateral Near:     Physical Exam Constitutional:      Appearance: Normal appearance.  HENT:     Head: Normocephalic.     Right Ear: Tympanic membrane, ear canal and external ear normal.     Left Ear: Tympanic membrane, ear canal and external ear normal.  Nose: Congestion present. No rhinorrhea.     Mouth/Throat:     Pharynx: No oropharyngeal exudate or posterior oropharyngeal erythema.  Eyes:     Extraocular Movements: Extraocular movements intact.  Pulmonary:     Effort: Pulmonary effort is normal.  Neurological:     Mental Status: He is alert and oriented to person, place, and time.      UC Treatments / Results  Labs (all labs ordered are listed, but only abnormal results are displayed) Labs Reviewed - No data to display  EKG   Radiology No results found.  Procedures Procedures (including critical care time)  Medications Ordered in UC Medications - No data to display  Initial Impression / Assessment and Plan / UC Course  I have reviewed the triage vital signs and the nursing notes.  Pertinent labs & imaging results that were available during my care of the patient were reviewed by me and considered in my medical decision making (see chart for details).  Seasonal Allergies  Vitals are stable, patient no signs of distress nontoxic-appearing, viral testing declined, recommended consistent and  daily use of antihistamine and recommended additional supportive care for symptom management, may follow-up as needed, work note given Final Clinical Impressions(s) / UC Diagnoses   Final diagnoses:  Seasonal allergies     Discharge Instructions      \Today your symptoms are related to your seasonal allergies  Ensure that you are taking daily antihistamine consistently to help minimize symptoms You can take Tylenol and/or Ibuprofen as needed for fever reduction and pain relief.   For cough: honey 1/2 to 1 teaspoon (you can dilute the honey in water or another fluid).  You can also use guaifenesin and dextromethorphan for cough. You can use a humidifier for chest congestion and cough.  If you don't have a humidifier, you can sit in the bathroom with the hot shower running.      For sore throat: try warm salt water gargles, cepacol lozenges, throat spray, warm tea or water with lemon/honey, popsicles or ice, or OTC cold relief medicine for throat discomfort.   For congestion: take a daily anti-histamine like Zyrtec, Claritin, and a oral decongestant, such as pseudoephedrine.  You can also use Flonase 1-2 sprays in each nostril daily.   It is important to stay hydrated: drink plenty of fluids (water, gatorade/powerade/pedialyte, juices, or teas) to keep your throat moisturized and help further relieve irritation/discomfort.    ED Prescriptions   None    PDMP not reviewed this encounter.   Valinda Hoar, NP 05/30/23 [phone]

## 2023-05-30 NOTE — ED Triage Notes (Signed)
 Patient presents to [facility] for evaluation after "my allergies kept me up all night".  He was signed in the ED, but decided to come to [place] after the wait.  He is not concerned for any type of illness, just say it is his "hay fever" and he needs a work note for missing work due to being up all night

## 2023-05-30 NOTE — Discharge Instructions (Addendum)
\  Today your symptoms are related to your seasonal allergies  Ensure that you are taking daily antihistamine consistently to help minimize symptoms You can take Tylenol and/or Ibuprofen as needed for fever reduction and pain relief.   For cough: honey 1/2 to 1 teaspoon (you can dilute the honey in water or another fluid).  You can also use guaifenesin and dextromethorphan for cough. You can use a humidifier for chest congestion and cough.  If you don't have a humidifier, you can sit in the bathroom with the hot shower running.      For sore throat: try warm salt water gargles, cepacol lozenges, throat spray, warm tea or water with lemon/honey, popsicles or ice, or OTC cold relief medicine for throat discomfort.   For congestion: take a daily anti-histamine like Zyrtec, Claritin, and a oral decongestant, such as pseudoephedrine.  You can also use Flonase 1-2 sprays in each nostril daily.   It is important to stay hydrated: drink plenty of fluids (water, gatorade/powerade/pedialyte, juices, or teas) to keep your throat moisturized and help further relieve irritation/discomfort.

## 2024-01-09 ENCOUNTER — Other Ambulatory Visit: Payer: Self-pay

## 2024-01-09 ENCOUNTER — Encounter: Payer: Self-pay | Admitting: Emergency Medicine

## 2024-01-09 ENCOUNTER — Emergency Department
Admission: EM | Admit: 2024-01-09 | Discharge: 2024-01-09 | Disposition: A | Discharge status: Home / Self Care | Payer: Self-pay | Attending: Emergency Medicine | Admitting: Emergency Medicine

## 2024-01-09 DIAGNOSIS — X58XXXA Exposure to other specified factors, initial encounter: Secondary | ICD-10-CM | POA: Insufficient documentation

## 2024-01-09 DIAGNOSIS — K029 Dental caries, unspecified: Secondary | ICD-10-CM | POA: Insufficient documentation

## 2024-01-09 DIAGNOSIS — S025XXA Fracture of tooth (traumatic), initial encounter for closed fracture: Secondary | ICD-10-CM | POA: Insufficient documentation

## 2024-01-09 MED ORDER — LIDOCAINE VISCOUS HCL 2 % MT SOLN
15.0000 mL | Freq: Once | OROMUCOSAL | Status: AC
  Administered 2024-01-09: 15 mL via OROMUCOSAL
  Filled 2024-01-09: qty 15

## 2024-01-09 MED ORDER — AMOXICILLIN-POT CLAVULANATE 875-125 MG PO TABS
1.0000 | ORAL_TABLET | Freq: Once | ORAL | Status: AC
  Administered 2024-01-09: 1 via ORAL
  Filled 2024-01-09: qty 1

## 2024-01-09 MED ORDER — HYDROCODONE-ACETAMINOPHEN 5-325 MG PO TABS: 2.0000 | ORAL_TABLET | Freq: Four times a day (QID) | ORAL | 0 refills | Status: AC | PRN

## 2024-01-09 MED ORDER — AMOXICILLIN-POT CLAVULANATE 875-125 MG PO TABS: 1.0000 | ORAL_TABLET | Freq: Two times a day (BID) | ORAL | 0 refills | Status: AC

## 2024-01-09 MED ORDER — KETOROLAC TROMETHAMINE 30 MG/ML IJ SOLN
30.0000 mg | Freq: Once | INTRAMUSCULAR | Status: AC
  Administered 2024-01-09: 30 mg via INTRAMUSCULAR
  Filled 2024-01-09: qty 1

## 2024-01-09 MED ORDER — CHLORHEXIDINE GLUCONATE 0.12 % MT SOLN: 15.0000 mL | Freq: Two times a day (BID) | OROMUCOSAL | 0 refills | Status: AC

## 2024-01-09 MED ORDER — MAGIC MOUTHWASH W/LIDOCAINE: 5.0000 mL | Freq: Four times a day (QID) | ORAL | 0 refills | Status: AC | PRN

## 2024-01-09 NOTE — ED Provider Notes (Signed)
 [facility] Provider Note    Event Date/Time   First MD Initiated Contact with Patient 01/09/24 0123     (approximate)   History   Dental Pain   HPI Andrew Hanna is a 33 y.o. male presents for evaluation of dental pain and swelling.  He said that he has had a broken tooth on the right lower part of his jaw for a while now, but it has gotten swollen and infected.  He knows he needs to go to the dentist but has not been able to do so yet.  He has no swelling of his face but the area around the tooth and his gums is swollen and painful.  No difficulty swallowing or speaking.  No recent fever.     Physical Exam   Triage Vital Signs: ED Triage Vitals  Encounter Vitals Group     BP 01/09/24 0122 (!) 138/99     Girls Systolic BP Percentile --      Girls Diastolic BP Percentile --      Boys Systolic BP Percentile --      Boys Diastolic BP Percentile --      Pulse Rate 01/09/24 0122 73     Resp 01/09/24 0122 17     Temp 01/09/24 0122 97.7 F (36.5 C)     Temp Source 01/09/24 0122 Oral     SpO2 01/09/24 0122 99 %     Weight 01/09/24 0121 88.5 kg (195 lb)     Height 01/09/24 0121 1.93 m (6' 4)     Head Circumference --      Peak Flow --      Pain Score 01/09/24 0121 10     Pain Loc --      Pain Education --      Exclude from Growth Chart --     Most recent vital signs: Vitals:   01/09/24 0122  BP: (!) 138/99  Pulse: 73  Resp: 17  Temp: 97.7 F (36.5 C)  SpO2: 99%    General: Awake, appears uncomfortable but not in severe distress. Oropharynx: Poor dentition with multiple dental caries.  Difficult to be certain because of the missing teeth, but there seems to be tooth #29 in the right lower part of his jaw that is fractured off nearly at the gumline, which appears chronic, but the surrounding gum is swollen and indurated, no fluctuance or indication that there is a drainable abscess at this time.  No evidence of Ludwig's angina or other  infection.  No facial swelling or tenderness to palpation of the maxilla. CV:  Good peripheral perfusion.  Resp:  Normal effort. Speaking easily and comfortably, no accessory muscle usage nor intercostal retractions.   Abd:  No distention.    ED Results / Procedures / Treatments   Labs (all labs ordered are listed, but only abnormal results are displayed) Labs Reviewed - No data to display    PROCEDURES:  Critical Care performed: No  Procedures    IMPRESSION / MDM / ASSESSMENT AND PLAN / ED COURSE  I reviewed the triage vital signs and the nursing notes.                              Differential diagnosis includes, but is not limited to, dental infection, dental caries, dental fracture,.  Periapical abscess.  Patient's presentation is most consistent with acute presentation with potential threat to life or bodily  function.   Interventions/Medications given:  Medications  lidocaine  (XYLOCAINE ) 2 % viscous mouth solution 15 mL (has no administration in time range)  amoxicillin -clavulanate (AUGMENTIN) 875-125 MG per tablet 1 tablet (has no administration in time range)  ketorolac  (TORADOL ) 30 MG/ML injection 30 mg (has no administration in time range)    (Note:  hospital course my include additional interventions and/or labs/studies not listed above.)   Suspect chronic caries and dental fracture with acute infection.  No fluctuance to suggest a drainable fluid collection.  No facial involvement at this time, no systemic symptoms.  Treating empirically with Augmentin as well as the prescriptions listed below to help with symptomatic management.  I gave multiple outpatient follow-up options including Dr. Dannial and the dental resource guide.  Patient knows he needs to follow-up with a specialist as an outpatient.  I gave my usual and customary return precautions.         FINAL CLINICAL IMPRESSION(S) / ED DIAGNOSES   Final diagnoses:  Closed fracture of tooth, initial  encounter  Pain due to dental caries  Infected dental caries     Rx / DC Orders   ED Discharge Orders          Ordered    magic mouthwash w/lidocaine  SOLN  4 times daily PRN       Note to Pharmacy: Please mix viscous lidocaine  2% with magic mouthwash solution so that the lidocaine  comprises approximately 25 % of the total solution.   01/09/24 0136    amoxicillin -clavulanate (AUGMENTIN) 875-125 MG tablet  2 times daily        01/09/24 0136    chlorhexidine (PERIDEX) 0.12 % solution  2 times daily        01/09/24 0136    HYDROcodone -acetaminophen  (NORCO/VICODIN) 5-325 MG tablet  Every 6 hours PRN        01/09/24 0136             Note:  This document was prepared using Dragon voice recognition software and may include unintentional dictation errors.   Gordan Huxley, MD 01/09/24 [phone]

## 2024-01-09 NOTE — Discharge Instructions (Signed)
You have been seen in the Emergency Department (ED) today for dental pain.  Please take your prescribed antibiotic.  You may take pain medication as needed but ONLY as prescribed.  You should also take over-the-counter pain medication such as ibuprofen according to the label instructions unless a doctor has previously told you to avoid this type of medication (due to stomach ulcers, for example).  Alternatively you can take ibuprofen 600 mg by mouth three times daily with meals for no more than 5 days. ° °Take Norco as prescribed for severe pain. Do not drink alcohol, drive or participate in any other potentially dangerous activities while taking this medication as it may make you sleepy. Do not take this medication with any other sedating medications, either prescription or over-the-counter. If you were prescribed Percocet or Vicodin, do not take these with acetaminophen (Tylenol) as it is already contained within these medications. °  °This medication is an opiate (or narcotic) pain medication and can be habit forming.  Use it as little as possible to achieve adequate pain control.  Do not use or use it with extreme caution if you have a history of opiate abuse or dependence.  If you are on a pain contract with your primary care doctor or a pain specialist, be sure to let them know you were prescribed this medication today from the [facility].  This medication is intended for your use only - do not give any to anyone else and keep it in a secure place where nobody else, especially children, have access to it.  It will also cause or worsen constipation, so you may want to consider taking an over-the-counter stool softener while you are taking this medication. ° °Please see you dentist as soon as possible; only a dentist will be able to fix your problem(s).  Please see below for dental follow up options. ° °Return to the ED if you develop worsening pain, fever, pus/drainage, difficulty  breathing, or other symptoms that concern you. °

## 2024-01-09 NOTE — ED Triage Notes (Signed)
 Reports 1 day of dental pain. Right lower mostly. Reports impacted teeth. Severe pain. Took a half an oxy prior to arrival with no relief.
